# Patient Record
Sex: Female | Born: 1940 | Race: White | Hispanic: No | Marital: Married | Smoking: Never smoker
Health system: Southern US, Community
[De-identification: ages and names within clinical notes are randomized; demographics above are authoritative.]

## PROBLEM LIST (undated history)

## (undated) DIAGNOSIS — M419 Scoliosis, unspecified: Secondary | ICD-10-CM

## (undated) DIAGNOSIS — E039 Hypothyroidism, unspecified: Secondary | ICD-10-CM

## (undated) DIAGNOSIS — E539 Vitamin B deficiency, unspecified: Secondary | ICD-10-CM

## (undated) DIAGNOSIS — D126 Benign neoplasm of colon, unspecified: Secondary | ICD-10-CM

## (undated) DIAGNOSIS — I2510 Atherosclerotic heart disease of native coronary artery without angina pectoris: Secondary | ICD-10-CM

## (undated) DIAGNOSIS — K219 Gastro-esophageal reflux disease without esophagitis: Secondary | ICD-10-CM

## (undated) DIAGNOSIS — H524 Presbyopia: Secondary | ICD-10-CM

## (undated) DIAGNOSIS — I10 Essential (primary) hypertension: Secondary | ICD-10-CM

## (undated) DIAGNOSIS — M5136 Other intervertebral disc degeneration, lumbar region: Secondary | ICD-10-CM

## (undated) DIAGNOSIS — J189 Pneumonia, unspecified organism: Secondary | ICD-10-CM

## (undated) DIAGNOSIS — H5203 Hypermetropia, bilateral: Secondary | ICD-10-CM

## (undated) DIAGNOSIS — L719 Rosacea, unspecified: Secondary | ICD-10-CM

## (undated) DIAGNOSIS — Z973 Presence of spectacles and contact lenses: Secondary | ICD-10-CM

## (undated) DIAGNOSIS — R0600 Dyspnea, unspecified: Secondary | ICD-10-CM

## (undated) DIAGNOSIS — M8580 Other specified disorders of bone density and structure, unspecified site: Secondary | ICD-10-CM

## (undated) DIAGNOSIS — H52203 Unspecified astigmatism, bilateral: Secondary | ICD-10-CM

## (undated) DIAGNOSIS — N200 Calculus of kidney: Secondary | ICD-10-CM

## (undated) DIAGNOSIS — E785 Hyperlipidemia, unspecified: Secondary | ICD-10-CM

## (undated) DIAGNOSIS — E559 Vitamin D deficiency, unspecified: Secondary | ICD-10-CM

## (undated) DIAGNOSIS — M51369 Other intervertebral disc degeneration, lumbar region without mention of lumbar back pain or lower extremity pain: Secondary | ICD-10-CM

## (undated) DIAGNOSIS — E041 Nontoxic single thyroid nodule: Secondary | ICD-10-CM

## (undated) DIAGNOSIS — M1990 Unspecified osteoarthritis, unspecified site: Secondary | ICD-10-CM

## (undated) HISTORY — PX: FRACTURE SURGERY: SHX138

## (undated) HISTORY — PX: COLONOSCOPY: SHX174

---

## 2016-08-14 ENCOUNTER — Other Ambulatory Visit: Payer: Self-pay | Admitting: Certified Registered Nurse Anesthetist

## 2016-08-14 DIAGNOSIS — R5381 Other malaise: Secondary | ICD-10-CM

## 2016-08-15 ENCOUNTER — Other Ambulatory Visit: Payer: Self-pay | Admitting: Certified Registered Nurse Anesthetist

## 2016-08-15 DIAGNOSIS — E2839 Other primary ovarian failure: Secondary | ICD-10-CM

## 2016-09-01 ENCOUNTER — Ambulatory Visit: Payer: Self-pay | Admitting: Physician Assistant

## 2016-09-17 ENCOUNTER — Other Ambulatory Visit: Payer: Self-pay

## 2016-09-17 ENCOUNTER — Encounter (HOSPITAL_COMMUNITY): Payer: Self-pay | Admitting: *Deleted

## 2016-09-17 ENCOUNTER — Encounter (HOSPITAL_COMMUNITY)
Admission: RE | Admit: 2016-09-17 | Discharge: 2016-09-17 | Disposition: A | Discharge status: Home / Self Care | Payer: Medicare Other | Source: Ambulatory Visit | Attending: Orthopedic Surgery | Admitting: Orthopedic Surgery

## 2016-09-17 DIAGNOSIS — I471 Supraventricular tachycardia: Secondary | ICD-10-CM | POA: Diagnosis not present

## 2016-09-17 DIAGNOSIS — M5136 Other intervertebral disc degeneration, lumbar region: Secondary | ICD-10-CM | POA: Insufficient documentation

## 2016-09-17 DIAGNOSIS — I2510 Atherosclerotic heart disease of native coronary artery without angina pectoris: Secondary | ICD-10-CM | POA: Insufficient documentation

## 2016-09-17 DIAGNOSIS — Z01810 Encounter for preprocedural cardiovascular examination: Secondary | ICD-10-CM | POA: Insufficient documentation

## 2016-09-17 DIAGNOSIS — E785 Hyperlipidemia, unspecified: Secondary | ICD-10-CM | POA: Insufficient documentation

## 2016-09-17 DIAGNOSIS — Z01812 Encounter for preprocedural laboratory examination: Secondary | ICD-10-CM | POA: Diagnosis present

## 2016-09-17 DIAGNOSIS — K219 Gastro-esophageal reflux disease without esophagitis: Secondary | ICD-10-CM | POA: Diagnosis not present

## 2016-09-17 DIAGNOSIS — M48061 Spinal stenosis, lumbar region without neurogenic claudication: Secondary | ICD-10-CM | POA: Insufficient documentation

## 2016-09-17 HISTORY — DX: Vitamin D deficiency, unspecified: E55.9

## 2016-09-17 HISTORY — DX: Hypothyroidism, unspecified: E03.9

## 2016-09-17 HISTORY — DX: Hyperlipidemia, unspecified: E78.5

## 2016-09-17 HISTORY — DX: Atherosclerotic heart disease of native coronary artery without angina pectoris: I25.10

## 2016-09-17 HISTORY — DX: Presbyopia: H52.4

## 2016-09-17 HISTORY — DX: Presbyopia: H52.03

## 2016-09-17 HISTORY — DX: Rosacea, unspecified: L71.9

## 2016-09-17 HISTORY — DX: Calculus of kidney: N20.0

## 2016-09-17 HISTORY — DX: Gastro-esophageal reflux disease without esophagitis: K21.9

## 2016-09-17 HISTORY — DX: Nontoxic single thyroid nodule: E04.1

## 2016-09-17 HISTORY — DX: Pneumonia, unspecified organism: J18.9

## 2016-09-17 HISTORY — DX: Other specified disorders of bone density and structure, unspecified site: M85.80

## 2016-09-17 HISTORY — DX: Unspecified osteoarthritis, unspecified site: M19.90

## 2016-09-17 HISTORY — DX: Presence of spectacles and contact lenses: Z97.3

## 2016-09-17 HISTORY — DX: Scoliosis, unspecified: M41.9

## 2016-09-17 HISTORY — DX: Other intervertebral disc degeneration, lumbar region without mention of lumbar back pain or lower extremity pain: M51.369

## 2016-09-17 HISTORY — DX: Unspecified astigmatism, bilateral: H52.203

## 2016-09-17 HISTORY — DX: Vitamin B deficiency, unspecified: E53.9

## 2016-09-17 HISTORY — DX: Other intervertebral disc degeneration, lumbar region: M51.36

## 2016-09-17 HISTORY — DX: Benign neoplasm of colon, unspecified: D12.6

## 2016-09-17 LAB — TYPE AND SCREEN
ABO/RH(D): O POS
Antibody Screen: NEGATIVE

## 2016-09-17 LAB — CBC
HCT: 38.8 % (ref 36.0–46.0)
Hemoglobin: 13.1 g/dL (ref 12.0–15.0)
MCH: 30.6 pg (ref 26.0–34.0)
MCHC: 33.8 g/dL (ref 30.0–36.0)
MCV: 90.7 fL (ref 78.0–100.0)
PLATELETS: 196 10*3/uL (ref 150–400)
RBC: 4.28 MIL/uL (ref 3.87–5.11)
RDW: 13 % (ref 11.5–15.5)
WBC: 6.3 10*3/uL (ref 4.0–10.5)

## 2016-09-17 LAB — BASIC METABOLIC PANEL
Anion gap: 7 (ref 5–15)
BUN: 12 mg/dL (ref 6–20)
CHLORIDE: 108 mmol/L (ref 101–111)
CO2: 24 mmol/L (ref 22–32)
CREATININE: 0.76 mg/dL (ref 0.44–1.00)
Calcium: 10.1 mg/dL (ref 8.9–10.3)
GFR calc Af Amer: >60 mL/min (ref >60)
GFR calc non Af Amer: >60 mL/min (ref >60)
Glucose, Bld: 99 mg/dL (ref 65–99)
Potassium: 3.9 mmol/L (ref 3.5–5.1)
Sodium: 139 mmol/L (ref 135–145)

## 2016-09-17 LAB — SURGICAL PCR SCREEN
MRSA, PCR: NEGATIVE
Staphylococcus aureus: NEGATIVE

## 2016-09-17 LAB — ABO/RH: ABO/RH(D): O POS

## 2016-09-17 NOTE — Progress Notes (Signed)
Pt denies SOB, chest pain, and being under the care of a cardiologist. Pt denies having a cardiac cath. Pt stated that a stress test was performed at [facility] but is not sure if an echo was done; records requested. Pt chart forwarded to anesthesia for review (order for consult).

## 2016-09-17 NOTE — Pre-Procedure Instructions (Signed)
    Clarissa Laird  09/17/2016      [facility] [address] [place] [address] Phone: [phone] Fax: [phone]    Your procedure is scheduled on Wednesday, September 24, 2016  Report to [facility] Admitting at 6:30 A.M.  Call this number if you have problems the morning of surgery:  [phone]   Remember:  Do not eat food or drink liquids after midnight: Tuesday, September 23, 2016  Take these medicines the morning of surgery with A SIP OF WATER : levothyroxine (SYNTHROID),  omeprazole (PRILOSEC), if needed Tylenol for pain. Stop taking Aspirin, vitamins, fish oil and herbal medications. Do not take any NSAIDs ie: Ibuprofen, Advil, Naproxen (Aleve), Motrin, Advil, BC and Goody Powder or any medication containing Aspirin; stop now.  Do not wear jewelry, make-up or nail polish.  Do not wear lotions, powders, or perfumes, or deoderant.  Do not shave 48 hours prior to surgery.    Do not bring valuables to the hospital.  [facility] is not responsible for any belongings or valuables.  Contacts, dentures or bridgework may not be worn into surgery.  Leave your suitcase in the car.  After surgery it may be brought to your room. For patients admitted to the hospital, discharge time will be determined by your treatment team. Special instructions:Shower the night before surgery and the morning of surgery with CHG. Please read over the following fact sheets that you were given. Pain Booklet, Coughing and Deep Breathing, Blood Transfusion Information, MRSA Information and Surgical Site Infection Prevention

## 2016-09-18 ENCOUNTER — Encounter (HOSPITAL_COMMUNITY): Payer: Self-pay

## 2016-09-18 NOTE — Progress Notes (Addendum)
Anesthesia Chart Review: Patient is a 76 year old female scheduled for XLIF L3-4 on 09/24/16 and posterior decompression and in situ fusion L2-5 on 09/25/16 by Dr. Rolena Infante.   History includes never smoker, GERD, scoliosis, HLD, hypothyroidism, rosacea, nephrolithiasis, non-toxic thyroid nodule. Mild coronary calcification on 03/29/13 chest CT [facility], Care Everywhere)--no evidence of interstitial fibrosis noted.  - PCP is Dr. Gilford Rile with [facility], last visit 08/28/16 with Caryl Ada, [place] for preoperative evaluation. She wrote:  "Surgical clearance: The patients overall risk for this surgery is considered to be moderate (6-10%) due to her age, and her complicated health problems. Having said that she is remarkably functional for these problems. I have ordered a series of blood work today and if this blood work shows stability, she may proceed with surgery with no further testing and with known risks. I have discussed these risks with the patient today and they include bleeding complications, infection complications, blood clot complications, respiratory difficulty/complications, and cardiovascular difficulty/complications. The patient understands the risks and would like to proceed. I recommend that this patient have Dr. Rolena Infante' usual DVT prophylaxis for the back surgery, early ambulation as possible, pulmonary toilet, and if complications or problems consider an inpatient medical consult."  - She is not routinely followed by cardiology but according to note from Dr. Gilford Rile and Caryl Ada, [place] (in [place]), patient was referred to cardiology approximately three years ago after finding of coronary calcifications on chest CT. She had a stress test and reportedly was told only PRN follow-up needed. I am attempting to find out where testing was done so I can get records to review.   Meds include ASA 81 mg, vitamin D, B-12, levothyroxine, Prilosec, Align, Crestor.   BP (!)  155/73   Pulse 68   Temp 36.8 C (Oral)   Resp 18   Ht 5\' 4"  (1.626 m)   Wt 133 lb 1 oz (60.4 kg)   SpO2 100%   BMI 22.84 kg/m   EKG 09/17/16: SR with premature supraventricular complexes, moderate voltage criteria for LVH, may be normal variant.  Chest CT 03/29/13 [facility]; Care Everywhere): IMPRESSION: 1. No evidence of interstitial fibrosis. 2. No lung nodules.No consolidation or edema. 3. Mild apical parenchymal scarring.  Preoperative labs noted. CBC and BMET WNL.A1c 5.4 on 08/28/16 at PCP office.   Attempting to get cardiology records from ~ three years ago. Will plan to update note once received.  George Hugh [facility] Short Stay Center/Anesthesiology Phone [phone] 09/18/2016 3:36 PM  Addendum: I spoke with patient. She reports she was referred to Dr. Geraldo Pitter (who was with [facility] at the time) about 3 years ago for finding of coronary calcifications on chest CT and family history of CHF. (Her father died in the 17's at age 51 from angina and CHF.) She reports a normal stress test (+/- echo) with PRN follow-up recommended. She denied chest pain, SOB, edema, syncope. She walks every day for 45 minutes at a slow, steady pace. She reports she is able to "walk and talk." Her last ASA dose was on 09/16/16. Will see if I can get cardiology records from [facility], but based on available information and medical clearance I anticipate that she can proceed as planned if no acute changes. (Update 09/19/16 2:44 PM: So far, only a bone density report received. No cardiac studies. I also called and spoke with staff at Dr. Willette Pa office and they do not have copies of cardiac records--or at least not readily  available. I reviewed above with anesthesiologist Dr. Suzette Battiest who agrees that based on records available, EKG, activity tolerance, and medical clearance it is anticipated that she can proceed as planned. If any additional records received prior to surgery then  I will update my note.)  George Hugh [facility] Short Stay Center/Anesthesiology Phone [phone] 09/18/2016 5:44 PM  Addendum: Records received from [place]. Last visit with Dr. Jyl Heinz was on 01/25/14. Carotid U/S ordered (see below) for findings of carotid bruits.    - Echo 04/09/10: Summary: Normal left ventricular systolic function with mild concentric LVH. LVEF 70%. E/A flow reversal was suggestive of diastolic dysfunction. Mitral annular calcification with mild mitral insufficiency. Aortic sclerosis with mild aortic insufficiency. Mild tricuspid regurgitation with normal pulmonary artery pressures.  - Exercise myocardial perfusion study 03/10/13: Post stress gated SPECT was performed and showed normal wall motion and thickening with ejection fraction calculated 68%. Tomographic images at rest and stress showed no evidence of ischemia. Soft tissue attenuation noted in the inferior wall of the LV on the scan. Patient achieved 88% of maximal predicted heart rate. Good exercise capacity. Resting EKG showed normal sinus rhythm and was within normal limits. There were no ECG changes with exercise.  Conclusion: Negative exercise perfusion stress test.   - Carotid U/S 02/01/14:  No evidence of hemodynamically significant stenosis in bilateral carotid bifurcation. Abnormal thyroid appearance, consider further evaluation. (Per Care Everywhere, she did have a thyroid ultrasound on 08/27/15 although I do not have the report.)   George Hugh [facility] Short Stay Center/Anesthesiology Phone [phone] 09/23/2016 4:05 PM

## 2016-09-23 NOTE — Anesthesia Preprocedure Evaluation (Addendum)
Anesthesia Evaluation  Patient identified by MRN, date of birth, ID band Patient awake    Reviewed: Allergy & Precautions, H&P , NPO status , Patient's Chart, lab work & pertinent test results  Airway Mallampati: II  TM Distance: >3 FB Neck ROM: Full    Dental no notable dental hx. (+) Teeth Intact, Dental Advisory Given   Pulmonary neg pulmonary ROS,    Pulmonary exam normal breath sounds clear to auscultation       Cardiovascular Exercise Tolerance: Good negative cardio ROS   Rhythm:Regular Rate:Normal     Neuro/Psych negative neurological ROS  negative psych ROS   GI/Hepatic Neg liver ROS, GERD  Medicated and Controlled,  Endo/Other  Hypothyroidism   Renal/GU negative Renal ROS  negative genitourinary   Musculoskeletal  (+) Arthritis , Osteoarthritis,    Abdominal   Peds  Hematology negative hematology ROS (+)   Anesthesia Other Findings   Reproductive/Obstetrics negative OB ROS                           Anesthesia Physical Anesthesia Plan  ASA: II  Anesthesia Plan: General   Post-op Pain Management:    Induction: Intravenous  PONV Risk Score and Plan: 4 or greater and Ondansetron, Dexamethasone and Midazolam  Airway Management Planned: Oral ETT  Additional Equipment:   Intra-op Plan:   Post-operative Plan: Extubation in OR  Informed Consent: I have reviewed the patients History and Physical, chart, labs and discussed the procedure including the risks, benefits and alternatives for the proposed anesthesia with the patient or authorized representative who has indicated his/her understanding and acceptance.   Dental advisory given  Plan Discussed with: CRNA  Anesthesia Plan Comments:         Anesthesia Quick Evaluation

## 2016-09-24 ENCOUNTER — Inpatient Hospital Stay (HOSPITAL_COMMUNITY): Payer: Medicare Other | Admitting: Vascular Surgery

## 2016-09-24 ENCOUNTER — Inpatient Hospital Stay (HOSPITAL_COMMUNITY): Payer: Medicare Other | Admitting: Anesthesiology

## 2016-09-24 ENCOUNTER — Inpatient Hospital Stay (HOSPITAL_COMMUNITY): Payer: Medicare Other | Admitting: Certified Registered"

## 2016-09-24 ENCOUNTER — Inpatient Hospital Stay (HOSPITAL_COMMUNITY): Payer: Medicare Other

## 2016-09-24 ENCOUNTER — Inpatient Hospital Stay (HOSPITAL_COMMUNITY)
Admission: RE | Admit: 2016-09-24 | Discharge: 2016-09-25 | DRG: 460 | Disposition: A | Discharge status: Home / Self Care | Payer: Medicare Other | Source: Ambulatory Visit | Attending: Orthopedic Surgery | Admitting: Orthopedic Surgery

## 2016-09-24 ENCOUNTER — Encounter (HOSPITAL_COMMUNITY)
Admission: RE | Disposition: A | Discharge status: Home / Self Care | Payer: Self-pay | Source: Ambulatory Visit | Attending: Orthopedic Surgery

## 2016-09-24 DIAGNOSIS — Z809 Family history of malignant neoplasm, unspecified: Secondary | ICD-10-CM

## 2016-09-24 DIAGNOSIS — M48062 Spinal stenosis, lumbar region with neurogenic claudication: Principal | ICD-10-CM | POA: Diagnosis present

## 2016-09-24 DIAGNOSIS — M8580 Other specified disorders of bone density and structure, unspecified site: Secondary | ICD-10-CM | POA: Diagnosis present

## 2016-09-24 DIAGNOSIS — M5136 Other intervertebral disc degeneration, lumbar region: Secondary | ICD-10-CM | POA: Diagnosis not present

## 2016-09-24 DIAGNOSIS — Z9889 Other specified postprocedural states: Secondary | ICD-10-CM

## 2016-09-24 DIAGNOSIS — E039 Hypothyroidism, unspecified: Secondary | ICD-10-CM | POA: Diagnosis present

## 2016-09-24 DIAGNOSIS — K219 Gastro-esophageal reflux disease without esophagitis: Secondary | ICD-10-CM | POA: Diagnosis present

## 2016-09-24 DIAGNOSIS — E539 Vitamin B deficiency, unspecified: Secondary | ICD-10-CM | POA: Diagnosis present

## 2016-09-24 DIAGNOSIS — I2510 Atherosclerotic heart disease of native coronary artery without angina pectoris: Secondary | ICD-10-CM | POA: Diagnosis not present

## 2016-09-24 DIAGNOSIS — Z7982 Long term (current) use of aspirin: Secondary | ICD-10-CM

## 2016-09-24 DIAGNOSIS — Z981 Arthrodesis status: Secondary | ICD-10-CM

## 2016-09-24 DIAGNOSIS — E559 Vitamin D deficiency, unspecified: Secondary | ICD-10-CM | POA: Diagnosis present

## 2016-09-24 DIAGNOSIS — Z833 Family history of diabetes mellitus: Secondary | ICD-10-CM

## 2016-09-24 DIAGNOSIS — M4320 Fusion of spine, site unspecified: Secondary | ICD-10-CM

## 2016-09-24 DIAGNOSIS — Z8249 Family history of ischemic heart disease and other diseases of the circulatory system: Secondary | ICD-10-CM

## 2016-09-24 DIAGNOSIS — E785 Hyperlipidemia, unspecified: Secondary | ICD-10-CM | POA: Diagnosis not present

## 2016-09-24 DIAGNOSIS — Z87442 Personal history of urinary calculi: Secondary | ICD-10-CM | POA: Diagnosis not present

## 2016-09-24 DIAGNOSIS — Z780 Asymptomatic menopausal state: Secondary | ICD-10-CM

## 2016-09-24 DIAGNOSIS — Z419 Encounter for procedure for purposes other than remedying health state, unspecified: Secondary | ICD-10-CM

## 2016-09-24 DIAGNOSIS — M4186 Other forms of scoliosis, lumbar region: Secondary | ICD-10-CM | POA: Diagnosis present

## 2016-09-24 DIAGNOSIS — M549 Dorsalgia, unspecified: Secondary | ICD-10-CM | POA: Diagnosis present

## 2016-09-24 HISTORY — PX: ANTERIOR LAT LUMBAR FUSION: SHX1168

## 2016-09-24 SURGERY — ANTERIOR LATERAL LUMBAR FUSION 1 LEVEL
Anesthesia: General

## 2016-09-24 MED ORDER — FENTANYL CITRATE (PF) 100 MCG/2ML IJ SOLN
INTRAMUSCULAR | Status: DC | PRN
Start: 2016-09-24 — End: 2016-09-24
  Administered 2016-09-24: 100 ug via INTRAVENOUS
  Administered 2016-09-24 (×3): 50 ug via INTRAVENOUS

## 2016-09-24 MED ORDER — ACETAMINOPHEN 10 MG/ML IV SOLN
INTRAVENOUS | Status: DC | PRN
  Administered 2016-09-24: 1000 mg via INTRAVENOUS

## 2016-09-24 MED ORDER — FENTANYL CITRATE (PF) 250 MCG/5ML IJ SOLN
INTRAMUSCULAR | Status: AC
  Filled 2016-09-24: qty 5

## 2016-09-24 MED ORDER — ONDANSETRON HCL 4 MG/2ML IJ SOLN
4.0000 mg | Freq: Four times a day (QID) | INTRAMUSCULAR | Status: DC | PRN
  Administered 2016-09-24 – 2016-09-25 (×2): 4 mg via INTRAVENOUS
  Filled 2016-09-24 (×2): qty 2

## 2016-09-24 MED ORDER — ACETAMINOPHEN 10 MG/ML IV SOLN
INTRAVENOUS | Status: AC
  Filled 2016-09-24: qty 100

## 2016-09-24 MED ORDER — MAGNESIUM CITRATE PO SOLN: 1.0000 | Freq: Once | ORAL | Status: DC | PRN

## 2016-09-24 MED ORDER — LACTATED RINGERS IV SOLN
INTRAVENOUS | Status: DC | PRN
  Administered 2016-09-24 (×2): via INTRAVENOUS

## 2016-09-24 MED ORDER — BUPIVACAINE-EPINEPHRINE 0.25% -1:200000 IJ SOLN
INTRAMUSCULAR | Status: DC | PRN
  Administered 2016-09-24: 15 mL

## 2016-09-24 MED ORDER — PROPOFOL 10 MG/ML IV BOLUS
INTRAVENOUS | Status: DC | PRN
  Administered 2016-09-24: 120 mg via INTRAVENOUS

## 2016-09-24 MED ORDER — PHENYLEPHRINE 40 MCG/ML (10ML) SYRINGE FOR IV PUSH (FOR BLOOD PRESSURE SUPPORT)
PREFILLED_SYRINGE | INTRAVENOUS | Status: AC
  Filled 2016-09-24: qty 10

## 2016-09-24 MED ORDER — LEVOTHYROXINE SODIUM 100 MCG PO TABS
50.0000 ug | ORAL_TABLET | Freq: Every day | ORAL | Status: DC
  Administered 2016-09-25: 50 ug via ORAL
  Filled 2016-09-24: qty 1

## 2016-09-24 MED ORDER — ONDANSETRON HCL 4 MG/2ML IJ SOLN
INTRAMUSCULAR | Status: DC | PRN
  Administered 2016-09-24: 4 mg via INTRAVENOUS

## 2016-09-24 MED ORDER — MENTHOL 3 MG MT LOZG: 1.0000 | LOZENGE | OROMUCOSAL | Status: DC | PRN

## 2016-09-24 MED ORDER — DEXAMETHASONE SODIUM PHOSPHATE 10 MG/ML IJ SOLN
INTRAMUSCULAR | Status: AC
  Filled 2016-09-24: qty 1

## 2016-09-24 MED ORDER — THROMBIN 20000 UNITS EX SOLR
CUTANEOUS | Status: AC
Start: 2016-09-24 — End: 2016-09-24
  Filled 2016-09-24: qty 20000

## 2016-09-24 MED ORDER — LIDOCAINE HCL (CARDIAC) 20 MG/ML IV SOLN
INTRAVENOUS | Status: DC | PRN
  Administered 2016-09-24: 60 mg via INTRAVENOUS

## 2016-09-24 MED ORDER — PROPOFOL 500 MG/50ML IV EMUL
INTRAVENOUS | Status: DC | PRN
  Administered 2016-09-24: 50 ug/kg/min via INTRAVENOUS

## 2016-09-24 MED ORDER — CEFAZOLIN SODIUM-DEXTROSE 2-4 GM/100ML-% IV SOLN
2.0000 g | Freq: Three times a day (TID) | INTRAVENOUS | Status: AC
  Administered 2016-09-24 (×2): 2 g via INTRAVENOUS
  Filled 2016-09-24 (×2): qty 100

## 2016-09-24 MED ORDER — ACETAMINOPHEN 650 MG RE SUPP: 650.0000 mg | RECTAL | Status: DC | PRN

## 2016-09-24 MED ORDER — DOCUSATE SODIUM 100 MG PO CAPS
100.0000 mg | ORAL_CAPSULE | Freq: Two times a day (BID) | ORAL | Status: DC
  Administered 2016-09-24 – 2016-09-25 (×2): 100 mg via ORAL
  Filled 2016-09-24 (×2): qty 1

## 2016-09-24 MED ORDER — OXYCODONE HCL 5 MG PO TABS
5.0000 mg | ORAL_TABLET | ORAL | Status: DC | PRN
  Administered 2016-09-24: 10 mg via ORAL
  Administered 2016-09-25: 5 mg via ORAL
  Filled 2016-09-24 (×2): qty 2

## 2016-09-24 MED ORDER — DEXAMETHASONE SODIUM PHOSPHATE 10 MG/ML IJ SOLN
INTRAMUSCULAR | Status: DC | PRN
  Administered 2016-09-24: 10 mg via INTRAVENOUS

## 2016-09-24 MED ORDER — PHENYLEPHRINE HCL 10 MG/ML IJ SOLN
INTRAVENOUS | Status: DC | PRN
  Administered 2016-09-24: 50 ug/min via INTRAVENOUS

## 2016-09-24 MED ORDER — MORPHINE SULFATE (PF) 4 MG/ML IV SOLN
INTRAVENOUS | Status: AC
  Filled 2016-09-24: qty 1

## 2016-09-24 MED ORDER — MORPHINE SULFATE (PF) 4 MG/ML IV SOLN
1.0000 mg | INTRAVENOUS | Status: DC | PRN
  Administered 2016-09-24 – 2016-09-25 (×2): 2 mg via INTRAVENOUS
  Filled 2016-09-24: qty 1

## 2016-09-24 MED ORDER — HYDROMORPHONE HCL 1 MG/ML IJ SOLN
0.2500 mg | INTRAMUSCULAR | Status: DC | PRN
  Administered 2016-09-24 (×4): 0.5 mg via INTRAVENOUS

## 2016-09-24 MED ORDER — CEFAZOLIN SODIUM-DEXTROSE 2-4 GM/100ML-% IV SOLN: 2.0000 g | INTRAVENOUS | Status: DC

## 2016-09-24 MED ORDER — SODIUM CHLORIDE 0.9 % IV SOLN: 250.0000 mL | INTRAVENOUS | Status: DC

## 2016-09-24 MED ORDER — HYDROMORPHONE HCL 1 MG/ML IJ SOLN
INTRAMUSCULAR | Status: AC
  Administered 2016-09-24: 0.5 mg via INTRAVENOUS
  Filled 2016-09-24: qty 1

## 2016-09-24 MED ORDER — ACETAMINOPHEN 500 MG PO TABS: 1000.0000 mg | ORAL_TABLET | Freq: Three times a day (TID) | ORAL | Status: DC | PRN

## 2016-09-24 MED ORDER — HYDROXYZINE HCL 50 MG/ML IM SOLN
50.0000 mg | Freq: Four times a day (QID) | INTRAMUSCULAR | Status: DC | PRN
  Administered 2016-09-24: 50 mg via INTRAMUSCULAR
  Filled 2016-09-24: qty 1

## 2016-09-24 MED ORDER — ROSUVASTATIN CALCIUM 5 MG PO TABS
5.0000 mg | ORAL_TABLET | Freq: Every day | ORAL | Status: DC
  Administered 2016-09-25: 5 mg via ORAL
  Filled 2016-09-24: qty 1

## 2016-09-24 MED ORDER — POLYETHYLENE GLYCOL 3350 17 G PO PACK: 17.0000 g | PACK | Freq: Every day | ORAL | Status: DC | PRN

## 2016-09-24 MED ORDER — LIDOCAINE HCL 4 % EX SOLN
CUTANEOUS | Status: DC | PRN
  Administered 2016-09-24: 4 mL via TOPICAL

## 2016-09-24 MED ORDER — 0.9 % SODIUM CHLORIDE (POUR BTL) OPTIME
TOPICAL | Status: DC | PRN
  Administered 2016-09-24: 1000 mL

## 2016-09-24 MED ORDER — ONDANSETRON HCL 4 MG PO TABS: 4.0000 mg | ORAL_TABLET | Freq: Four times a day (QID) | ORAL | Status: DC | PRN

## 2016-09-24 MED ORDER — ACETAMINOPHEN 325 MG PO TABS: 650.0000 mg | ORAL_TABLET | ORAL | Status: DC | PRN

## 2016-09-24 MED ORDER — CEFAZOLIN SODIUM-DEXTROSE 2-4 GM/100ML-% IV SOLN
2.0000 g | INTRAVENOUS | Status: AC
  Administered 2016-09-24: 2 g via INTRAVENOUS
  Filled 2016-09-24: qty 100

## 2016-09-24 MED ORDER — HYDROMORPHONE HCL 1 MG/ML IJ SOLN
INTRAMUSCULAR | Status: AC
Start: 2016-09-24 — End: 2016-09-24
  Administered 2016-09-24: 0.5 mg via INTRAVENOUS
  Filled 2016-09-24: qty 1

## 2016-09-24 MED ORDER — PHENOL 1.4 % MT LIQD: 1.0000 | OROMUCOSAL | Status: DC | PRN

## 2016-09-24 MED ORDER — LACTATED RINGERS IV SOLN
INTRAVENOUS | Status: DC
  Administered 2016-09-24: 23:00:00 via INTRAVENOUS

## 2016-09-24 MED ORDER — SODIUM CHLORIDE 0.9% FLUSH: 3.0000 mL | INTRAVENOUS | Status: DC | PRN

## 2016-09-24 MED ORDER — SUCCINYLCHOLINE CHLORIDE 20 MG/ML IJ SOLN
INTRAMUSCULAR | Status: DC | PRN
  Administered 2016-09-24: 100 mg via INTRAVENOUS

## 2016-09-24 MED ORDER — BUPIVACAINE-EPINEPHRINE 0.25% -1:200000 IJ SOLN
INTRAMUSCULAR | Status: AC
  Filled 2016-09-24: qty 1

## 2016-09-24 MED ORDER — PANTOPRAZOLE SODIUM 40 MG PO TBEC
40.0000 mg | DELAYED_RELEASE_TABLET | Freq: Every day | ORAL | Status: DC
  Administered 2016-09-25: 40 mg via ORAL
  Filled 2016-09-24: qty 1

## 2016-09-24 MED ORDER — SODIUM CHLORIDE 0.9% FLUSH
3.0000 mL | Freq: Two times a day (BID) | INTRAVENOUS | Status: DC
  Administered 2016-09-24: 3 mL via INTRAVENOUS

## 2016-09-24 MED ORDER — THROMBIN 20000 UNITS EX KIT
PACK | CUTANEOUS | Status: DC | PRN
  Administered 2016-09-24: 20000 [IU] via TOPICAL

## 2016-09-24 MED ORDER — PROPOFOL 10 MG/ML IV BOLUS
INTRAVENOUS | Status: AC
  Filled 2016-09-24: qty 20

## 2016-09-24 SURGICAL SUPPLY — 73 items
BENZOIN TINCTURE PRP APPL 2/3 (GAUZE/BANDAGES/DRESSINGS) ×3 IMPLANT
BLADE CLIPPER SURG (BLADE) IMPLANT
BLADE SURG 10 STRL SS (BLADE) ×3 IMPLANT
BONE MATRIX OSTEOCEL PRO MED (Bone Implant) ×3 IMPLANT
CAGE MODULUS XL 12X18X55 - 10 (Cage) ×3 IMPLANT
CLOSURE STERI-STRIP 1/2X4 (GAUZE/BANDAGES/DRESSINGS) ×1
CLOSURE WOUND 1/2 X4 (GAUZE/BANDAGES/DRESSINGS)
CLSR STERI-STRIP ANTIMIC 1/2X4 (GAUZE/BANDAGES/DRESSINGS) ×2 IMPLANT
CORDS BIPOLAR (ELECTRODE) ×3 IMPLANT
COVER SURGICAL LIGHT HANDLE (MISCELLANEOUS) ×3 IMPLANT
DRAPE C-ARM 42X72 X-RAY (DRAPES) ×3 IMPLANT
DRAPE C-ARMOR (DRAPES) ×3 IMPLANT
DRAPE INCISE IOBAN 66X45 STRL (DRAPES) ×3 IMPLANT
DRAPE ORTHO SPLIT 77X108 STRL (DRAPES) ×2
DRAPE POUCH INSTRU U-SHP 10X18 (DRAPES) ×3 IMPLANT
DRAPE SURG ORHT 6 SPLT 77X108 (DRAPES) ×1 IMPLANT
DRAPE U-SHAPE 47X51 STRL (DRAPES) ×6 IMPLANT
DRSG AQUACEL AG ADV 3.5X10 (GAUZE/BANDAGES/DRESSINGS) IMPLANT
DRSG OPSITE POSTOP 3X4 (GAUZE/BANDAGES/DRESSINGS) ×3 IMPLANT
DRSG OPSITE POSTOP 4X6 (GAUZE/BANDAGES/DRESSINGS) ×3 IMPLANT
DURAPREP 26ML APPLICATOR (WOUND CARE) ×3 IMPLANT
ELECT BLADE 4.0 EZ CLEAN MEGAD (MISCELLANEOUS) ×3
ELECT PENCIL ROCKER SW 15FT (MISCELLANEOUS) ×3 IMPLANT
ELECT REM PT RETURN 9FT ADLT (ELECTROSURGICAL) ×3
ELECTRODE BLDE 4.0 EZ CLN MEGD (MISCELLANEOUS) ×1 IMPLANT
ELECTRODE REM PT RTRN 9FT ADLT (ELECTROSURGICAL) ×1 IMPLANT
GAUZE SPONGE 4X4 16PLY XRAY LF (GAUZE/BANDAGES/DRESSINGS) ×3 IMPLANT
GLOVE BIO SURGEON STRL SZ 6.5 (GLOVE) ×2 IMPLANT
GLOVE BIO SURGEONS STRL SZ 6.5 (GLOVE) ×1
GLOVE BIOGEL PI IND STRL 6.5 (GLOVE) ×1 IMPLANT
GLOVE BIOGEL PI IND STRL 8.5 (GLOVE) ×1 IMPLANT
GLOVE BIOGEL PI INDICATOR 6.5 (GLOVE) ×2
GLOVE BIOGEL PI INDICATOR 8.5 (GLOVE) ×2
GLOVE SS BIOGEL STRL SZ 8.5 (GLOVE) ×1 IMPLANT
GLOVE SUPERSENSE BIOGEL SZ 8.5 (GLOVE) ×2
GOWN STRL REUS W/ TWL XL LVL3 (GOWN DISPOSABLE) ×2 IMPLANT
GOWN STRL REUS W/TWL 2XL LVL3 (GOWN DISPOSABLE) ×6 IMPLANT
GOWN STRL REUS W/TWL XL LVL3 (GOWN DISPOSABLE) ×4
KIT BASIN OR (CUSTOM PROCEDURE TRAY) ×3 IMPLANT
KIT DILATOR XLIF 5 (KITS) ×1 IMPLANT
KIT ROOM TURNOVER OR (KITS) ×3 IMPLANT
KIT SURGICAL ACCESS MAXCESS 4 (KITS) ×3 IMPLANT
KIT XLIF (KITS) ×2
MODULE NVM5 NEXT GEN EMG (NEEDLE) ×3 IMPLANT
NEEDLE 22X1 1/2 (OR ONLY) (NEEDLE) ×3 IMPLANT
NEEDLE SPNL 18GX3.5 QUINCKE PK (NEEDLE) ×3 IMPLANT
NS IRRIG 1000ML POUR BTL (IV SOLUTION) ×3 IMPLANT
PACK LAMINECTOMY ORTHO (CUSTOM PROCEDURE TRAY) ×3 IMPLANT
PACK UNIVERSAL I (CUSTOM PROCEDURE TRAY) ×3 IMPLANT
PAD ARMBOARD 7.5X6 YLW CONV (MISCELLANEOUS) ×6 IMPLANT
PLATE DECADE XLIP 2H SZ12 (Plate) ×3 IMPLANT
PUTTY DBX 5CC (Putty) ×3 IMPLANT
SCREW DECADE 5.5X50 (Screw) ×6 IMPLANT
SPONGE LAP 4X18 X RAY DECT (DISPOSABLE) ×3 IMPLANT
SPONGE SURGIFOAM ABS GEL 100 (HEMOSTASIS) ×3 IMPLANT
STAPLER VISISTAT 35W (STAPLE) ×3 IMPLANT
STRIP CLOSURE SKIN 1/2X4 (GAUZE/BANDAGES/DRESSINGS) IMPLANT
SURGIFLO W/THROMBIN 8M KIT (HEMOSTASIS) IMPLANT
SUT BONE WAX W31G (SUTURE) ×3 IMPLANT
SUT MON AB 3-0 SH 27 (SUTURE) ×4
SUT MON AB 3-0 SH27 (SUTURE) ×2 IMPLANT
SUT VIC AB 1 CT1 27 (SUTURE) ×4
SUT VIC AB 1 CT1 27XBRD ANBCTR (SUTURE) ×2 IMPLANT
SUT VIC AB 1 CTX 36 (SUTURE) ×4
SUT VIC AB 1 CTX36XBRD ANBCTR (SUTURE) ×2 IMPLANT
SUT VIC AB 2-0 CT1 18 (SUTURE) ×6 IMPLANT
SYR BULB IRRIGATION 50ML (SYRINGE) ×3 IMPLANT
SYR CONTROL 10ML LL (SYRINGE) ×3 IMPLANT
TAPE CLOTH 4X10 WHT NS (GAUZE/BANDAGES/DRESSINGS) ×6 IMPLANT
TOWEL OR 17X24 6PK STRL BLUE (TOWEL DISPOSABLE) ×3 IMPLANT
TOWEL OR 17X26 10 PK STRL BLUE (TOWEL DISPOSABLE) ×3 IMPLANT
TRAY FOLEY CATH SILVER 16FR (SET/KITS/TRAYS/PACK) ×3 IMPLANT
WATER STERILE IRR 1000ML POUR (IV SOLUTION) ×3 IMPLANT

## 2016-09-24 NOTE — Anesthesia Procedure Notes (Signed)
Procedure Name: Intubation Date/Time: 09/24/2016 8:32 AM Performed by: Lavell Luster Pre-anesthesia Checklist: Patient identified, Emergency Drugs available, Suction available, Patient being monitored and Timeout performed Patient Re-evaluated:Patient Re-evaluated prior to induction Oxygen Delivery Method: Circle system utilized Preoxygenation: Pre-oxygenation with 100% oxygen Induction Type: IV induction Ventilation: Mask ventilation without difficulty Laryngoscope Size: Mac and 3 Grade View: Grade I Tube type: Oral Number of attempts: 1 Airway Equipment and Method: Stylet and LTA kit utilized Placement Confirmation: ETT inserted through vocal cords under direct vision,  positive ETCO2 and breath sounds checked- equal and bilateral Secured at: 22 cm Tube secured with: Tape Dental Injury: Teeth and Oropharynx as per pre-operative assessment

## 2016-09-24 NOTE — H&P (Signed)
History of Present Illness  The patient is a 76 year old female who comes in today for a preoperative History and Physical. The patient is scheduled for a Day 1- XLIF L3-4 Day 2 Posterior Decompression w/Insitu Fusion L2-5 to be performed by Dr. Duane Lope D. Rolena Infante, MD at [facility] on 01/29/2016 and 01/30/2016 . Pt reports hx of good health.  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 Congestive Heart Failure  Father. Diabetes Mellitus  Brother. First Degree Relatives  reported Heart Disease  Father. Heart disease in female family member before age 56   Social History Children  4 Current work status  retired Furniture conservator/restorer daily; does running / walking Living situation  live with spouse Marital status  married Never consumed alcohol  08/08/2016: Never consumed alcohol No history of drug/alcohol rehab  Not under pain contract  Number of flights of stairs before winded  2-3 Tobacco / smoke exposure  08/08/2016: no Tobacco use  Never smoker. 08/08/2016  Medication History  Tylenol (500MG  Capsule, Oral) Active. Levothyroxine Sodium (50MCG Tablet, Oral) Active. Rosuvastatin Calcium (5MG  Tablet, Oral) Active. Omeprazole (20MG  Capsule DR, Oral) Active. Cyanocobalamin (1000MCG/ML Solution, Injection) Active. Aspirin (81MG  Tablet DR, Oral) Active. Vitamin D (Cholecalciferol) (1000UNIT Capsule, Oral) Active. Align (Oral) Active. Doxycycline Hyclate (100MG  Capsule, Oral) Active. (prn) Medications Reconciled  Vitals 09/15/2016 1:23 PM Temp.: 98.29F  Pulse: 75 (Regular)  BP: 178/64 (Sitting, Left Arm, Standard)  General General  Appearance-Not in acute distress. Orientation-Oriented X3. Build & Nutrition-Well nourished and Well developed.  Integumentary General Characteristics Surgical Scars - no surgical scar evidence of previous lumbar surgery. Lumbar Spine-Skin examination of the lumbar spine is without deformity, skin lesions, lacerations or abrasions.  Chest and Lung Exam Auscultation Breath sounds - Normal and Clear.  Cardiovascular Auscultation Rhythm - Regular rate and rhythm.  Abdomen Palpation/Percussion Palpation and Percussion of the abdomen reveal - Soft, Non Tender and No Rebound tenderness.  Peripheral Vascular Lower Extremity Palpation - Posterior tibial pulse - Bilateral - 2+. Dorsalis pedis pulse - Bilateral - 2+.  Neurologic Sensation Lower Extremity - Bilateral - sensation is intact in the lower extremity. Reflexes Patellar Reflex - Bilateral - 2+. Achilles Reflex - Bilateral - 2+. Clonus - Bilateral - clonus not present. Hoffman's Sign - Bilateral - Hoffman's sign not present. Testing Seated Straight Leg Raise - Left - Seated straight leg raise positive. Right - Seated straight leg raise negative.  Musculoskeletal Spine/Ribs/Pelvis  Lumbosacral Spine: Inspection and Palpation - Tenderness - left lumbar paraspinals tender to palpation and right lumbar paraspinals tender to palpation. Strength and Tone: Strength - Hip Flexion - Bilateral - 5/5. Knee Extension - Bilateral - 5/5. Knee Flexion - Bilateral - 5/5. Ankle Dorsiflexion - Bilateral - 5/5. Ankle Plantarflexion - Bilateral - 5/5. Heel walk - Bilateral - able to heel walk with moderate difficulty. Toe Walk - Bilateral - able to walk on toes with moderate difficulty. ROM - Flexion - mildly decreased range of motion. Extension - moderately decreased range of motion and painful. Left Lateral Bending - moderately decreased range of motion and painful. Right Lateral Bending - moderately decreased range of motion and painful.  Right Rotation - moderately decreased range of motion and painful. Left Rotation - moderately  decreased range of motion and painful. Pain - extension is more painful than flexion. Lumbosacral Spine - Waddell's Signs - no Waddell's signs present. Lower Extremity Range of Motion - No true hip, knee or ankle pain with range of motion. Gait and Station - [employer] - no assistive devices.  RADIOGRAPHS At this point her we got plain x-rays of her lumbar spine. She has a degenerative scoliosis apex at L3-4 with a 15 degree local scoliotic curve.   Her MRI from 08/12/2016 shows multilevel spinal stenosis. She has severe central and bilateral lateral recess stenosis at 4-5 and 3-4. She has moderate stenosis 1-2 and 2-3. There is also a multilevel degenerative slip, 3-4 is a grade 1 borderline 2.  Plan Transcription  At this point to address the spinal stenosis would require a multilevel decompression. However, in doing that my concern is that I would intensify the degenerative curve by doing a decompression only and this would worsen her overall symptoms. Given that I think the best option is to do a lateral interbody fusion at L3-4 where she has significant stenosis as well as the apex of her curve and the degenerative slip. Then on the next day do a posterior decompression at 4-5, 3-4 and possibly 2-3 depending upon how severe it was during intraoperative evaluation. I would also do a supplemental posterior lateral arthrodesis.   We reviewed the risks which include infection, bleeding, nerve damage, death, stroke, paralysis, failure to heal, need for further surgery, weakness of the hip flexors due to injury to the lumbar plexus, difficulty walking, leak of spinal fluid, ongoing or worse pain, loss in bowel and bladder control. All of their questions were addressed.   The surgical plan is a lateral interbody fusion with lateral instrumentation at 3-4 on day one. If there is significant improvement in  her neuropathic leg pain and she is ambulating with no significant neuropathic leg pain, then I would just use a brace and monitor her and I would not do the posterior decompression. If she continues to have symptoms of neurogenic claudication then the second day I would move forward with a decompression and fusion.

## 2016-09-24 NOTE — Op Note (Signed)
Operative report.  Preoperative diagnosis. 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 Degenerative lumbar scoliosis. Degenerative lumbar spinal stenosis with neurogenic claudication L3-4, L4-5.  Postoperative diagnosis. Same.  Operative procedure. Lateral interbody fusion L3/4. Lateral instrumented fusion L3-4 using lateral 2-hole side plate.  Utilizing invasive modulus XL intervertebral cage. Size 12 x 18 x 55, 10 lordosis. 2 hole lateral plate secured with 50 mm screws x2  First assistant [facility].  Indications. This is a very active 76 year old woman is been having progressive debilitating back buttock bilateral leg pain. Medical symptoms consistent with lumbar spinal stenosis with neurogenic claudication. Imaging studies revealed degenerative lumbar scoliosis apex L3-4, as well as a grade 1 anterolisthesis of L3-4. Patient had maximum degree of spinal stenosis at L3-4, and to a lesser degree L4-5. After discussing treatment options, and failing all conservative measures, we elected to proceed with surgery. All appropriate risks benefits and alternatives were discussed with the patient. Plan was a staged lateral interbody fusion, followed by posterior decompression and in situ fusion if symptoms warrant.  Operative note. Patient was brought the operating room placed on the operating room table. After successful induction of general anesthesia and endotracheal intubation teds SCDs and a Foley were inserted. The appropriate intraoperative neuro monitoring pads were applied to the patient. She was turned into the right lateral decubitus position (right side up). Axillary roll was placed underneath the left chest all bony prominences were padded. Patient was secured to the bed with tape. X-rays were taken to center the L3-4 vertebral body in both the AP and lateral planes. Once I had adequately position the patient on the table the right lateral flank was prepped and draped in a standard  fashion.  Timeout was taken to confirm patient procedure and all other important data. X-ray was then brought into the field and identified the anterior posterior margins of the L3-4 disc space. This was marked out and infiltrated with quarter percent Marcaine with epinephrine. A second smaller incision was marked with the pen 1 fingerbreadth posteriorly and infiltrated as well. At this point the lateral incision was made and I sharply dissected down to the external oblique fascia. I then made a smaller posterior incision pass my finger down to the posterior aspect of the retroperitoneal fashion. I punched through this fascia and then palpated the psoas and mobilize the retroperitoneal fat. I then was able to palpate the undersurface of the oblique and then bluntly dissect through the external oblique until I could visualize my finger in the initial lateral incision. This allowed me to pass the trochars down to the lateral aspect of the spine. Once I confirmed I was right over the L3-4 disc space I stimulated the psoas to ensure that I was not traumatizing the plexus once I confirm this I advanced my first trocar through the psoas down to the lateral aspect of the disc. I then circumferentially stimulated to ensure was not traumatizing plexus and confirmed in both the AP and lateral planes and I was well-positioned. I then placed my guide pin through this to maintain the position. I then sequentially dilated stimulating circumferentially with each dilator until I placed the final working trocar. With a working portal established I stimulated 10 confirm that satisfactory position and then placed my shim into the disc space to maintain my position in the reduction of the retractor. With this positioned I then was able to rotate mobilize the psoas anteriorly so I could have excellent visualization of the lateral aspect of the disc space. Once  I confirm satisfactory position and exposure of the disc in both planes I  again stimulated to ensure that the plexus was not under compression or being traumatized.  I incised the disc space with a 15 blade scalpel and then using pituitary rongeurs curettes Kerrison rongeurs I removed all the disc material. I then used my osteotome to release the contralateral annulus. At this point I then sequentially trialed intervertebral spacers. The size 12 lordotic spacer provided the best overall fit. It corrected the deformity Rensi scoliosis) and provided excellent indirect decompression of the contralateral foramen.  At this point I then passed the endplates I had bleeding subchondral bone irrigated copiously normal saline. I then obtained the implant which is the modulus XL 12 x 18 x 55 10 lordotic new invasive cage. This was packed with OsteoCell plus DBX. I malleted this to the appropriate depth. It was well seated. There was no radiographic evidence of subsidence or improper malpositioning. With the cage properly positioned I then obtained the 2-hole lateral plate and secured it over the L3 and L4 vertebral bodies. I then placed the 55 mm screws. I got excellent purchase of both screws (bicortical). I then locked the screws with the torque according manufacturer's standards. The plate was also torqued and locked in place.  At this point I copiously irrigated the wound with normal saline and made sure hemostasis using bipolar cautery and FloSeal. The shim was removed as was a self-retaining retractors. Final x-rays were taken which were satisfactory. Hardware and graft were in good position. The coronal and sagittal alignment was improved compared to preop.  The fascia of the external oblique was closed with interrupted #1 Vicryl sutures. And the remainder of the incision was closed in a layered fashion with 2-0 Vicryl sutures 3-0 Monocryl. The small second incision was closed in a similar fashion after it was irrigated as well.  Dry dressings were applied and the patient was  ultimately extubated transferred the PACU that incident. The end of the case all needle sponge counts were correct. There were no adverse intraoperative events. Neuro monitoring remained normal there was no abnormal EMG activity.

## 2016-09-24 NOTE — Anesthesia Postprocedure Evaluation (Signed)
Anesthesia Post Note  Patient: Brittany Ashley  Procedure(s) Performed: Procedure(s) (LRB): XLIF L3-4 (N/A)     Patient location during evaluation: PACU Anesthesia Type: General Level of consciousness: awake and alert Pain management: pain level controlled Vital Signs Assessment: post-procedure vital signs reviewed and stable Respiratory status: spontaneous breathing, nonlabored ventilation and respiratory function stable Cardiovascular status: blood pressure returned to baseline and stable Postop Assessment: no signs of nausea or vomiting Anesthetic complications: no    Last Vitals:  Vitals:   09/24/16 1204 09/24/16 1219  BP: (!) 142/75 (!) 147/84  Pulse: 66 62  Resp: 18 15  Temp:    SpO2: 100% 100%    Last Pain:  Vitals:   09/24/16 1206  TempSrc:   PainSc: 7                  Merica Prell,W. EDMOND

## 2016-09-24 NOTE — Transfer of Care (Signed)
Immediate Anesthesia Transfer of Care Note  Patient: Brittany Ashley  Procedure(s) Performed: Procedure(s) with comments: XLIF L3-4 (N/A) - 3.5 hrs  Patient Location: PACU  Anesthesia Type:General  Level of Consciousness: awake, alert  and oriented  Airway & Oxygen Therapy: Patient connected to face mask oxygen  Post-op Assessment: Post -op Vital signs reviewed and stable  Post vital signs: stable  Last Vitals:  Vitals:   09/24/16 0641  BP: (!) 156/75  Pulse: 68  Resp: 18  Temp: 36.5 C  SpO2: 99%    Last Pain:  Vitals:   09/24/16 0641  TempSrc: Oral         Complications: No apparent anesthesia complications

## 2016-09-24 NOTE — Brief Op Note (Signed)
09/24/2016  10:44 AM  PATIENT:  Brittany Ashley  76 y.o. female  PRE-OPERATIVE DIAGNOSIS:  Lumbar degenerative scoliosis with spinal stenosis  POST-OPERATIVE DIAGNOSIS:  Lumbar degenerative scoliosis with spinal stenosis  PROCEDURE:  Procedure(s) with comments: XLIF L3-4 (N/A) - 3.5 hrs  SURGEON:  Surgeon(s) and Role:    Melina Schools, MD - Primary  PHYSICIAN ASSISTANT:   ASSISTANTS: Carmen Mayo   ANESTHESIA:   general  EBL:  Total I/O In: 1000 [I.V.:1000] Out: 700 [Urine:600; Blood:100]  BLOOD ADMINISTERED:none  DRAINS: none   LOCAL MEDICATIONS USED:  MARCAINE     SPECIMEN:  No Specimen  DISPOSITION OF SPECIMEN:  N/A  COUNTS:  YES  TOURNIQUET:  * No tourniquets in log *  DICTATION: .Dragon Dictation  PLAN OF CARE: Admit to inpatient   PATIENT DISPOSITION:  PACU - hemodynamically stable.

## 2016-09-24 NOTE — Discharge Instructions (Signed)
Spinal Fusion, Care After °These instructions give you information about caring for yourself after your procedure. Your doctor may also give you more specific instructions. Call your doctor if you have any problems or questions after your procedure. °Follow these instructions at home: °Medicines °· Take over-the-counter and prescription medicines only as told by your doctor. These include any medicines for pain. °· Do not drive for 24 hours if you received a sedative. °· Do not drive or use heavy machinery while taking prescription pain medicine. °· If you were prescribed an antibiotic medicine, take it as told by your doctor. Do not stop taking the antibiotic even if you start to feel better. °Surgical Cut (Incision) Care °· Follow instructions from your doctor about how to take care of your surgical cut. Make sure you: °? Wash your hands with soap and water before you change your bandage (dressing). If you cannot use soap and water, use hand sanitizer. °? Change your bandage as told by your doctor. °? Leave stitches (sutures), skin glue, or skin tape (adhesive) strips in place. They may need to stay in place for 2 weeks or longer. If tape strips get loose and curl up, you may trim the loose edges. Do not remove tape strips completely unless your doctor says it is okay. °· Keep your surgical cut clean and dry. Do not take baths, swim, or use a hot tub until your doctor says it is okay. °· Check your surgical cut and the area around it every day for: °? Redness. °? Swelling. °? Fluid. °Physical Activity °· Return to your normal activities as told by your doctor. Ask your doctor what activities are safe for you. Rest and protect your back as much as you can. °· Follow instructions from your doctor about how to move. Use good posture to help your spine heal. °· Do not lift anything that is heavier than 8 lb (3.6 kg) or as told by your doctor until he or she says that it is safe. Do not lift anything over your  head. °· Do not twist or bend at the waist until your doctor says it is okay. °· Avoid pushing or pulling motions. °· Do not sit or lie down in the same position for long periods of time. °· Do not start to exercise until your doctor says it is okay. Ask your doctor what kinds of exercise you can do to make your back stronger. °General instructions °· If you were given a brace, use it as told by your doctor. °· Wear compression stockings as told by your doctor. °· Do not use tobacco products. These include cigarettes, chewing tobacco, or e-cigarettes. If you need help quitting, ask your doctor. °· Keep all follow-up visits as told by your doctor. This is important. This includes any visits with your physical therapist, if this applies. °Contact a doctor if: °· Your pain gets worse. °· Your medicine does not help your pain. °· Your legs or feet become painful or swollen. °· Your surgical cut is red, swollen, or painful. °· You have fluid, blood, or pus coming from your surgical cut. °· You feel sick to your stomach (nauseous). °· You throw up (vomit). °· Your have weakness or loss of feeling (numbness) in your legs that is new or getting worse. °· You have a fever. °· You have trouble controlling when you pee (urinate) or poop (have a bowel movement). °Get help right away if: °· Your pain is very bad. °· You have   chest pain. °· You have trouble breathing. °· You start to have a cough. °These symptoms may be an emergency. Do not wait to see if the symptoms will go away. Get medical help right away. Call your local emergency services (911 in the [place] Do not drive yourself to the hospital. °This information is not intended to replace advice given to you by your health care provider. Make sure you discuss any questions you have with your health care provider. °Document Released: 05/02/2010 Document Revised: 09/04/2015 Document Reviewed: 06/21/2014 °Elsevier Interactive Patient Education © 2018 [employer] ° °

## 2016-09-24 NOTE — Evaluation (Signed)
Physical Therapy Evaluation Patient Details Name: Brittany Ashley MRN: [id number] DOB: 03-24-1940 Today's Date: 09/24/2016   History of Present Illness  Pt is a s/p L3-4 ALIF. PMH includes scoliosis and arthritis.  Clinical Impression  Pt is s/p surgery above with deficits below. PTA, pt reports she uses a walking stick to walk longer distances, but did walk regularly when at home. Upon eval, pt limited by nausea/vomiting, and post op pain. Limited to bed mobility this session, as pt had episode of nausea/vomiting upon sitting, which did not resolve until pt returned to supine. Pt did report LLE pain when moving to sitting which she experienced at baseline. Pt reports family will be able to assist as needed upon d/c. Will continue to follow and update recommendations as pt progresses.     Follow Up Recommendations No PT follow up;Supervision for mobility/OOB    Equipment Recommendations  3in1 (PT)    Recommendations for Other Services       Precautions / Restrictions Precautions Precautions: Back Precaution Booklet Issued: Yes (comment) Precaution Comments: Reviewed back precautions with pt.  Required Braces or Orthoses: Spinal Brace Spinal Brace: Thoracolumbosacral orthotic;Applied in sitting position Restrictions Weight Bearing Restrictions: No      Mobility  Bed Mobility Overal bed mobility: Needs Assistance Bed Mobility: Rolling;Sidelying to Sit;Sit to Sidelying Rolling: Min guard Sidelying to sit: Min guard     Sit to sidelying: Min guard General bed mobility comments: Min guard for safety. Verbal cuse of log roll technique. Upon sitting, pt with episode of nausea and vomiting. Pt requesting to lie back down. did report same pain in LLE during transition to sitting.   Transfers                 General transfer comment: Deferred secondary to nausea/vomiting.   Ambulation/Gait                Stairs            Wheelchair Mobility    Modified Rankin  (Stroke Patients Only)       Balance Overall balance assessment: Needs assistance Sitting-balance support: No upper extremity supported;Feet supported Sitting balance-Leahy Scale: Good                                       Pertinent Vitals/Pain Pain Assessment: 0-10 Pain Score: 6  Pain Location: L side where incision is  Pain Descriptors / Indicators: Aching;Operative site guarding Pain Intervention(s): Limited activity within patient's tolerance;Monitored during session;Repositioned    Home Living Family/patient expects to be discharged to:: Private residence Living Arrangements: Spouse/significant other Available Help at Discharge: Family;Available 24 hours/day Type of Home: House Home Access: Stairs to enter Entrance Stairs-Rails: None Entrance Stairs-Number of Steps: 2 Home Layout: Two level;Able to live on main level with bedroom/bathroom Home Equipment: [employer] [place] - single point (walking stick )      Prior Function Level of Independence: Independent with assistive device(s)         Comments: Used walking stick for long ambulation distances.     Hand Dominance        Extremity/Trunk Assessment   Upper Extremity Assessment Upper Extremity Assessment: Defer to OT evaluation    Lower Extremity Assessment Lower Extremity Assessment: Generalized weakness    Cervical / Trunk Assessment Cervical / Trunk Assessment: Other exceptions Cervical / Trunk Exceptions: s/p ALIF   Communication   Communication: No difficulties  Cognition  Arousal/Alertness: Awake/alert Behavior During Therapy: WFL for tasks assessed/performed Overall Cognitive Status: Within Functional Limits for tasks assessed                                        General Comments General comments (skin integrity, edema, etc.): Reviewed how to put on brace with pt. Reviewed exercise program with pt as well.     Exercises     Assessment/Plan    PT Assessment  Patient needs continued PT services  PT Problem List Decreased strength;Decreased activity tolerance;Decreased balance;Decreased mobility;Decreased knowledge of precautions;Pain       PT Treatment Interventions DME instruction;Gait training;Stair training;Functional mobility training;Therapeutic activities;Therapeutic exercise;Balance training;Neuromuscular re-education;Patient/family education    PT Goals (Current goals can be found in the Care Plan section)  Acute Rehab PT Goals Patient Stated Goal: to feel better  PT Goal Formulation: With patient Time For Goal Achievement: 10/01/16 Potential to Achieve Goals: Good    Frequency Min 5X/week   Barriers to discharge        Co-evaluation               AM-PAC PT "6 Clicks" Daily Activity  Outcome Measure Difficulty turning over in bed (including adjusting bedclothes, sheets and blankets)?: Unable Difficulty moving from lying on back to sitting on the side of the bed? : Unable Difficulty sitting down on and standing up from a chair with arms (e.g., wheelchair, bedside commode, etc,.)?: Unable Help needed moving to and from a bed to chair (including a wheelchair)?: A Little Help needed walking in hospital room?: A Little Help needed climbing 3-5 steps with a railing? : A Lot 6 Click Score: 11    End of Session   Activity Tolerance: Treatment limited secondary to medical complications (Comment) (nausea/vomiting ) Patient left: in bed;with call bell/phone within reach;with family/visitor present Nurse Communication: Mobility status;Other (comment) (nausea/vomiting ) PT Visit Diagnosis: Other abnormalities of gait and mobility (R26.89);Pain Pain - part of body:  (L side at incision site )    Time: 3734-2876 PT Time Calculation (min) (ACUTE ONLY): 24 min   Charges:   PT Evaluation $PT Eval Moderate Complexity: 1 Mod PT Treatments $Therapeutic Activity: 8-22 mins   PT G Codes:        Leighton Ruff, PT, DPT   Acute Rehabilitation Services  Pager: [phone]   Rudean Hitt 09/24/2016, 4:15 PM

## 2016-09-25 ENCOUNTER — Inpatient Hospital Stay (HOSPITAL_COMMUNITY): Payer: Medicare Other

## 2016-09-25 ENCOUNTER — Encounter (HOSPITAL_COMMUNITY)
Admission: RE | Disposition: A | Discharge status: Home / Self Care | Payer: Self-pay | Source: Ambulatory Visit | Attending: Orthopedic Surgery

## 2016-09-25 ENCOUNTER — Encounter (HOSPITAL_COMMUNITY): Payer: Self-pay | Admitting: Orthopedic Surgery

## 2016-09-25 ENCOUNTER — Inpatient Hospital Stay (HOSPITAL_COMMUNITY): Admission: RE | Admit: 2016-09-25 | Payer: Medicare Other | Source: Ambulatory Visit | Admitting: Orthopedic Surgery

## 2016-09-25 SURGERY — POSTERIOR LUMBAR FUSION 3 LEVEL
Anesthesia: General

## 2016-09-25 MED FILL — Thrombin For Soln 20000 Unit: CUTANEOUS | Qty: 1 | Status: AC

## 2016-09-25 NOTE — Progress Notes (Signed)
Patient reports significant radicular leg pain relief Ambulating independently without significant pain No focal deficits She has decided not to move forward with posterior decompression Given pain relief and improved mobility I agree with the decision. Will plan on D/C today or in the AM Will check xrays this AM

## 2016-09-25 NOTE — Evaluation (Signed)
Occupational Therapy Evaluation Patient Details Name: Brittany Ashley MRN: [id number] DOB: 11-22-1940 Today's Date: 09/25/2016    History of Present Illness Pt is a s/p L3-4 ALIF. PMH includes scoliosis and arthritis.   Clinical Impression   Pt admitted with the above diagnoses and presents with below problem list. Pt will benefit from continued acute OT to address the below listed deficits and maximize independence with basic ADLs prior to d/c home. PTA pt was independent with ADLs. Pt is currently supervision to min guard with functional transfers/mobility and min A with LB ADLs.      Follow Up Recommendations  No OT follow up    Equipment Recommendations  3 in 1 bedside commode    Recommendations for Other Services       Precautions / Restrictions Precautions Precautions: Back Precaution Comments: reviewed Required Braces or Orthoses: Spinal Brace Spinal Brace: Thoracolumbosacral orthotic;Applied in sitting position Restrictions Weight Bearing Restrictions: No      Mobility Bed Mobility Overal bed mobility: Needs Assistance Bed Mobility: Sit to Sidelying;Rolling Rolling: Supervision       Sit to sidelying: Min assist General bed mobility comments: EOB/OOB  Transfers Overall transfer level: Needs assistance Equipment used: None Transfers: Sit to/from Stand Sit to Stand: Min guard         General transfer comment: increased time, min guard for safety    Balance Overall balance assessment: Needs assistance Sitting-balance support: No upper extremity supported;Feet supported Sitting balance-Leahy Scale: Good     Standing balance support: During functional activity;Bilateral upper extremity supported;No upper extremity supported Standing balance-Leahy Scale: Fair                             ADL either performed or assessed with clinical judgement   ADL Overall ADL's : Needs assistance/impaired Eating/Feeding: Set up;Sitting   Grooming:  Supervision/safety;Standing;Set up   Upper Body Bathing: Set up;Sitting   Lower Body Bathing: Minimal assistance;Sit to/from stand   Upper Body Dressing : Set up;Sitting   Lower Body Dressing: Minimal assistance;Sit to/from stand   Toilet Transfer: Supervision/safety;Ambulation   Toileting- Clothing Manipulation and Hygiene: Supervision/safety;Sit to/from stand   Tub/ Shower Transfer: Walk-in shower;Min guard;Ambulation;3 in 1   Functional mobility during ADLs: Supervision/safety General ADL Comments: Pt completed UB/LB dressing, toilet transfer and grooming task at sink as detailed above. Pt with difficulty elevating feet for LB ADLs. Discussed AE and technique to avoid bending.  Discussed DME.      Vision         Perception     Praxis      Pertinent Vitals/Pain Pain Assessment: Faces Pain Score: 4  Faces Pain Scale: Hurts little more Pain Location: incision site Pain Descriptors / Indicators: Aching;Operative site guarding Pain Intervention(s): Limited activity within patient's tolerance;Repositioned;Monitored during session     Hand Dominance     Extremity/Trunk Assessment Upper Extremity Assessment Upper Extremity Assessment: Overall WFL for tasks assessed   Lower Extremity Assessment Lower Extremity Assessment: Defer to PT evaluation   Cervical / Trunk Assessment Cervical / Trunk Assessment: Other exceptions Cervical / Trunk Exceptions: s/p ALIF    Communication Communication Communication: No difficulties   Cognition Arousal/Alertness: Awake/alert Behavior During Therapy: WFL for tasks assessed/performed Overall Cognitive Status: Within Functional Limits for tasks assessed  General Comments       Exercises     Shoulder Instructions      Home Living Family/patient expects to be discharged to:: Private residence Living Arrangements: Spouse/significant other Available Help at Discharge:  Family;Available 24 hours/day Type of Home: House Home Access: Stairs to enter [employer] of Steps: 2 Entrance Stairs-Rails: None Home Layout: Two level;Able to live on main level with bedroom/bathroom     Bathroom Shower/Tub: Occupational psychologist: Handicapped height     Home Equipment: [place] - single point (walking stick )          Prior Functioning/Environment Level of Independence: Independent with assistive device(s)        Comments: Used walking stick for long ambulation distances.        OT Problem List: Impaired balance (sitting and/or standing);Decreased knowledge of use of DME or AE;Decreased knowledge of precautions;Pain      OT Treatment/Interventions: Self-care/ADL training;DME and/or AE instruction;Visual/perceptual remediation/compensation;Patient/family education;Therapeutic activities    OT Goals(Current goals can be found in the care plan section) Acute Rehab OT Goals Patient Stated Goal: to feel better  OT Goal Formulation: With patient Time For Goal Achievement: 10/02/16 Potential to Achieve Goals: Good ADL Goals Pt Will Perform Lower Body Bathing: with modified independence;with adaptive equipment;sit to/from stand Pt Will Perform Lower Body Dressing: with modified independence;with adaptive equipment;sit to/from stand Pt Will Perform Tub/Shower Transfer: Shower transfer;with supervision;ambulating;3 in 1  OT Frequency: Min 2X/week   Barriers to D/C:            Co-evaluation              AM-PAC PT "6 Clicks" Daily Activity     Outcome Measure Help from another person eating meals?: None Help from another person taking care of personal grooming?: None Help from another person toileting, which includes using toliet, bedpan, or urinal?: None Help from another person bathing (including washing, rinsing, drying)?: A Little Help from another person to put on and taking off regular upper body clothing?: None Help from  another person to put on and taking off regular lower body clothing?: A Little 6 Click Score: 22   End of Session Equipment Utilized During Treatment: Back brace  Activity Tolerance: Patient tolerated treatment well Patient left: with call bell/phone within reach;Other (comment) (at sink)  OT Visit Diagnosis: Pain                Time: 9622-2979 OT Time Calculation (min): 20 min Charges:  OT General Charges $OT Visit: 1 Visit OT Evaluation $OT Eval Low Complexity: 1 Low G-Codes:       Hortencia Pilar 09/25/2016, 11:08 AM

## 2016-09-25 NOTE — Progress Notes (Signed)
    Subjective: Procedure(s) (LRB): Posterior decompression insitu fusion L2-5 (N/A) Day of Surgery  Patient reports pain as 2 on 0-10 scale.  Reports decreased leg pain reports incisional back pain   Foley in place Negative bowel movement Positive flatus Negative chest pain or shortness of breath  Objective: Vital signs in last 24 hours: Temp:  [97.6 F (36.4 C)-98.4 F (36.9 C)] 98.4 F (36.9 C) (09/06 0400) Pulse Rate:  [62-83] 83 (09/06 0400) Resp:  [15-33] 16 (09/06 0400) BP: (109-150)/(63-84) 123/71 (09/06 0400) SpO2:  [94 %-100 %] 99 % (09/06 0400)  Intake/Output from previous day: 09/05 0701 - 09/06 0700 In: 1609.8 [P.O.:240; I.V.:1369.8] Out: 1900 [Urine:1800; Blood:100]  Labs: No results for input(s): WBC, RBC, HCT, PLT in the last 72 hours. No results for input(s): NA, K, CL, CO2, BUN, CREATININE, GLUCOSE, CALCIUM in the last 72 hours. No results for input(s): LABPT, INR in the last 72 hours.  Physical Exam: Neurologically intact ABD soft Intact pulses distally Incision: dressing C/D/I Compartment soft  Assessment/Plan: Patient stable  xrays n/a Continue mobilization with physical therapy Continue care  Patient ambulated yesterday with less neuropathic pain.  Reports brief episode of thigh/leg pain consistent with neurogenic pain.   Incisional pain is mild-moderate. Will ambulate again and she will consider whether she would like to move forward with posterior decompression and insitu fusion today  Melina Schools, MD [place] [phone]

## 2016-09-25 NOTE — Progress Notes (Signed)
Pt received 3-n-1 from [facility] prior to D/C. Chuong Casebeer, RN  

## 2016-09-25 NOTE — Progress Notes (Signed)
Pt doing well. Pt and daughter given D/C instructions with verbal understanding. Rx's were sent to pharmacy by MD. Pt's incision is stained but has no sign of infection. Pt's IV was removed prior to D/C. Pt D/C'd home via wheelchair per MD order. Pt is stable @ D/C and has no other needs at this time. Holli Humbles, RN

## 2016-09-25 NOTE — Anesthesia Preprocedure Evaluation (Signed)
Anesthesia Evaluation  Patient identified by MRN, date of birth, ID band Patient awake    Reviewed: Allergy & Precautions, H&P , NPO status , Patient's Chart, lab work & pertinent test results  Airway Mallampati: II  TM Distance: >3 FB Neck ROM: Full    Dental no notable dental hx. (+) Teeth Intact, Dental Advisory Given   Pulmonary neg pulmonary ROS,    Pulmonary exam normal breath sounds clear to auscultation       Cardiovascular Exercise Tolerance: Good negative cardio ROS   Rhythm:Regular Rate:Normal     Neuro/Psych negative neurological ROS  negative psych ROS   GI/Hepatic Neg liver ROS, GERD  Medicated and Controlled,  Endo/Other  Hypothyroidism   Renal/GU negative Renal ROS  negative genitourinary   Musculoskeletal  (+) Arthritis , Osteoarthritis,    Abdominal   Peds  Hematology negative hematology ROS (+)   Anesthesia Other Findings - Echo 04/09/10: Summary: Normal left ventricular systolic function with mild concentric LVH. LVEF 70%. E/A flow reversal was suggestive of diastolic dysfunction. Mitral annular calcification with mild mitral insufficiency. Aortic sclerosis with mild aortic insufficiency. Mild tricuspid regurgitation with normal pulmonary artery pressures.  - Exercise myocardial perfusion study 03/10/13: Post stress gated SPECT was performed and showed normal wall motion and thickening with ejection fraction calculated 68%. Tomographic images at rest and stress showed no evidence of ischemia. Soft tissue attenuation noted in the inferior wall of the LV on the scan. Patient achieved 88% of maximal predicted heart rate. Good exercise capacity. Resting EKG showed normal sinus rhythm and was within normal limits. There were no ECG changes with exercise.  Conclusion: Negative exercise perfusion stress test.    Reproductive/Obstetrics negative OB ROS                              Anesthesia Physical  Anesthesia Plan  ASA: III  Anesthesia Plan: General   Post-op Pain Management:    Induction: Intravenous  PONV Risk Score and Plan: 4 or greater and Ondansetron, Dexamethasone, Midazolam and Treatment may vary due to age or medical condition  Airway Management Planned: Oral ETT  Additional Equipment:   Intra-op Plan:   Post-operative Plan: Extubation in OR  Informed Consent: I have reviewed the patients History and Physical, chart, labs and discussed the procedure including the risks, benefits and alternatives for the proposed anesthesia with the patient or authorized representative who has indicated his/her understanding and acceptance.   Dental advisory given  Plan Discussed with: CRNA  Anesthesia Plan Comments:         Anesthesia Quick Evaluation

## 2016-09-25 NOTE — Progress Notes (Signed)
Physical Therapy Treatment Patient Details Name: Brittany Ashley MRN: [id number] DOB: 03/20/1940 Today's Date: 09/25/2016    History of Present Illness Pt is a s/p L3-4 ALIF. PMH includes scoliosis and arthritis.    PT Comments    Pt making good progress with mobility and tolerated ambulating in hallway with min guard and use of RW. PT reviewed 3/3 back precautions with pt and pt's daughter throughout. PT will continue to follow pt acutely to ensure a safe d/c home.    Follow Up Recommendations  No PT follow up;Supervision for mobility/OOB     Equipment Recommendations  3in1 (PT)    Recommendations for Other Services       Precautions / Restrictions Precautions Precautions: Back Precaution Comments: reviewed 3/3 back precautions with pt Required Braces or Orthoses: Spinal Brace Spinal Brace: Thoracolumbosacral orthotic;Applied in sitting position Restrictions Weight Bearing Restrictions: No    Mobility  Bed Mobility Overal bed mobility: Needs Assistance Bed Mobility: Sit to Sidelying;Rolling Rolling: Supervision       Sit to sidelying: Min assist General bed mobility comments: increased time, assist to return bilateral LEs to bed  Transfers Overall transfer level: Needs assistance Equipment used: 1 person hand held assist Transfers: Sit to/from Stand Sit to Stand: Min guard         General transfer comment: increased time, min guard for safety  Ambulation/Gait Ambulation/Gait assistance: Min guard Ambulation Distance (Feet): 300 Feet Assistive device: Rolling walker (2 wheeled) Gait Pattern/deviations: Step-through pattern;Decreased step length - right;Decreased step length - left;Decreased stride length Gait velocity: decreased Gait velocity interpretation: Below normal speed for age/gender General Gait Details: slow, cautious gait pattern; min guard for safety   Stairs            Wheelchair Mobility    Modified Rankin (Stroke Patients Only)        Balance Overall balance assessment: Needs assistance Sitting-balance support: No upper extremity supported;Feet supported Sitting balance-Leahy Scale: Good     Standing balance support: During functional activity;Bilateral upper extremity supported Standing balance-Leahy Scale: Poor                              Cognition Arousal/Alertness: Awake/alert Behavior During Therapy: WFL for tasks assessed/performed Overall Cognitive Status: Within Functional Limits for tasks assessed                                        Exercises      General Comments        Pertinent Vitals/Pain Pain Assessment: 0-10 Pain Score: 4  Pain Location: incision site Pain Descriptors / Indicators: Aching;Operative site guarding Pain Intervention(s): Monitored during session;Repositioned    Home Living                      Prior Function            PT Goals (current goals can now be found in the care plan section) Acute Rehab PT Goals PT Goal Formulation: With patient Time For Goal Achievement: 10/01/16 Potential to Achieve Goals: Good Progress towards PT goals: Progressing toward goals    Frequency    Min 5X/week      PT Plan Current plan remains appropriate    Co-evaluation              AM-PAC PT "6 Clicks" Daily Activity  Outcome Measure  Difficulty turning over in bed (including adjusting bedclothes, sheets and blankets)?: A Lot Difficulty moving from lying on back to sitting on the side of the bed? : Unable Difficulty sitting down on and standing up from a chair with arms (e.g., wheelchair, bedside commode, etc,.)?: Unable Help needed moving to and from a bed to chair (including a wheelchair)?: A Little Help needed walking in hospital room?: A Little Help needed climbing 3-5 steps with a railing? : A Little 6 Click Score: 13    End of Session Equipment Utilized During Treatment: Gait belt;Back brace Activity Tolerance:  Patient tolerated treatment well Patient left: in bed;with call bell/phone within reach;with family/visitor present Nurse Communication: Mobility status PT Visit Diagnosis: Other abnormalities of gait and mobility (R26.89);Pain Pain - part of body:  (back)     Time: 6759-1638 PT Time Calculation (min) (ACUTE ONLY): 18 min  Charges:  $Gait Training: 8-22 mins                    G Codes:       [place], [place], [employer] 09/25/2016, 9:24 AM

## 2016-09-29 ENCOUNTER — Encounter (HOSPITAL_COMMUNITY): Payer: Self-pay | Admitting: Orthopedic Surgery

## 2016-10-07 NOTE — Discharge Summary (Signed)
Physician Discharge Summary  Patient ID: Brittany Ashley MRN: [id number] DOB/AGE: 1940/02/06 76 y.o.  Admit date: 09/24/2016 Discharge date: 09/25/16  Admission Diagnoses:  Degenerative Disc Disease/ Scoliosis  Discharge Diagnoses:  Active Problems:   Status post lumbar spine operation   Status post lumbar spinal fusion   Past Medical History:  Diagnosis Date  . Benign neoplasm of colon   . Coronary atherosclerosis    coronary atherosclerosis (documented in history at [facility]); mild coronary calcification 03/29/13 chest CT--referred to cardiology by Dr. Bea Graff and had stress test with reportedly PRN f/u  . DDD (degenerative disc disease), lumbar   . GERD (gastroesophageal reflux disease)   . Hyperlipidemia   . Hyperopia with astigmatism and presbyopia, bilateral   . Hypothyroidism   . Nephrolithiasis   . Nontoxic thyroid nodule   . OA (osteoarthritis)   . Osteopenia   . Pneumonia   . Rosacea   . Scoliosis of lumbar spine    lumbar degenerative scoliosis with spinal stenosis  . Vitamin B deficiency   . Vitamin D deficiency   . Wears contact lenses    and glasses    Surgeries: Procedure(s): XLIF L3-4 on 09/24/2016   Consultants (if any):   Discharged Condition: Improved  Hospital Course: Brittany Ashley is an 76 y.o. female who was admitted 09/24/2016 with a diagnosis of Degenerative Disc Disease and scoliosis and went to the operating room on 09/24/2016 and underwent the above named procedures.  Post op day one pt reported decrease leg pain.  Incisional pain was controlled on oral medication.  Pt was urinating w/o fifficulty.  Pt was ambulating in hallway.  Pt was cleared by PT for DC.   She was given perioperative antibiotics:  Anti-infectives    Start     Dose/Rate Route Frequency Ordered Stop   09/24/16 1430  ceFAZolin (ANCEF) IVPB 2g/100 mL premix     2 g 200 mL/hr over 30 Minutes Intravenous Every 8 hours 09/24/16 1247 09/24/16 2320   09/24/16 0654   ceFAZolin (ANCEF) IVPB 2g/100 mL premix     2 g 200 mL/hr over 30 Minutes Intravenous 30 min pre-op 09/24/16 0654 09/24/16 0910   09/24/16 0653  ceFAZolin (ANCEF) IVPB 2g/100 mL premix  Status:  Discontinued     2 g 200 mL/hr over 30 Minutes Intravenous 30 min pre-op 09/24/16 0653 09/24/16 0654    .  She was given sequential compression devices, early ambulation, and TED for DVT prophylaxis.  She benefited maximally from the hospital stay and there were no complications.    Recent vital signs:  Vitals:   09/25/16 0748 09/25/16 1226  BP: (!) 113/56 111/80  Pulse: 65 82  Resp: 18 18  Temp: 98.3 F (36.8 C) 98.2 F (36.8 C)  SpO2: 97% 100%    Recent laboratory studies:  Lab Results  Component Value Date   HGB 13.1 09/17/2016   Lab Results  Component Value Date   WBC 6.3 09/17/2016   PLT 196 09/17/2016   No results found for: INR Lab Results  Component Value Date   NA 139 09/17/2016   K 3.9 09/17/2016   CL 108 09/17/2016   CO2 24 09/17/2016   BUN 12 09/17/2016   CREATININE 0.76 09/17/2016   GLUCOSE 99 09/17/2016    Discharge Medications:   Allergies as of 09/25/2016   No Known Allergies     Medication List    TAKE these medications   acetaminophen 500 MG tablet Commonly known as:  TYLENOL Take 1,000 mg by mouth every 8 (eight) hours as needed for mild pain (takes every morning and again if needed for pain).   ALIGN 4 MG Caps Take 1 capsule by mouth daily.   aspirin EC 81 MG tablet Take 81 mg by mouth daily.   B-12 COMPLIANCE INJECTION 1000 MCG/ML Kit Generic drug:  Cyanocobalamin Inject 1 mL as directed every 7 (seven) days. Saturdays   cholecalciferol 1000 units tablet Commonly known as:  VITAMIN D Take 1,000 Units by mouth daily.   levothyroxine 50 MCG tablet Commonly known as:  SYNTHROID, LEVOTHROID Take 50 mcg by mouth daily before breakfast.   omeprazole 20 MG capsule Commonly known as:  PRILOSEC Take 20 mg by mouth daily.   rosuvastatin  5 MG tablet Commonly known as:  CRESTOR Take 5 mg by mouth daily.            Discharge Care Instructions        Start     Ordered   09/24/16 0000  Incentive spirometry RT     09/24/16 1116      Diagnostic Studies: Dg Lumbar Spine 2-3 Views  Result Date: 09/25/2016 CLINICAL DATA:  Status post lateral interbody fusion at L3-4 yesterday. EXAM: LUMBAR SPINE - 2-3 VIEW COMPARISON:  AP and lateral intraoperative fluoro spot views. FINDINGS: The interbody fusion appliances appear stable in position. The vertebral bodies are preserved in height. The pedicles are intact. There is disc space narrowing at T12-L1 and L1-L2. IMPRESSION: No postprocedure complication following lateral interbody fusion at L3-4. Electronically Signed   By: David  Martinique M.D.   On: 09/25/2016 10:08   Dg Lumbar Spine 2-3 Views  Result Date: 09/24/2016 CLINICAL DATA:  Spinal stenosis. EXAM: LUMBAR SPINE - 2-3 VIEW; DG C-ARM 61-120 MIN COMPARISON:  None. FINDINGS: AP and lateral C-arm images demonstrate the patient has undergone lateral interbody fusion at L3-4. FLUOROSCOPY TIME:  2 minutes 13 seconds C-arm fluoroscopic images were obtained intraoperatively and submitted for post operative interpretation. IMPRESSION: Lateral interbody fusion performed at L3-4. Electronically Signed   By: Lorriane Shire M.D.   On: 09/24/2016 10:36   Dg C-arm 61-120 Min  Result Date: 09/24/2016 CLINICAL DATA:  Spinal stenosis. EXAM: LUMBAR SPINE - 2-3 VIEW; DG C-ARM 61-120 MIN COMPARISON:  None. FINDINGS: AP and lateral C-arm images demonstrate the patient has undergone lateral interbody fusion at L3-4. FLUOROSCOPY TIME:  2 minutes 13 seconds C-arm fluoroscopic images were obtained intraoperatively and submitted for post operative interpretation. IMPRESSION: Lateral interbody fusion performed at L3-4. Electronically Signed   By: Lorriane Shire M.D.   On: 09/24/2016 10:36    Disposition: 01-Home or Self Care Pt will present to clinic in 2  weeks Pt was provided post op medications  Discharge Instructions    Incentive spirometry RT    Complete by:  As directed          Signed: Valinda Hoar 10/07/2016, 12:12 PM

## 2016-10-21 ENCOUNTER — Encounter (HOSPITAL_COMMUNITY): Payer: Self-pay | Admitting: Orthopedic Surgery

## 2017-12-28 ENCOUNTER — Ambulatory Visit (HOSPITAL_COMMUNITY): Payer: Self-pay | Admitting: Orthopedic Surgery

## 2018-01-18 NOTE — Pre-Procedure Instructions (Signed)
Brittany Ashley  01/18/2018      [id number], [place] AT [address] [place] [address] Phone: [phone] Fax: [phone]    Your procedure is scheduled on Wednesday, January 8th.  Report to [facility] Admitting at 6:30 A.M.  Call this number if you have problems the morning of surgery:  [phone]   Remember:  Do not eat or drink after midnight.    Take these medicines the morning of surgery with A SIP OF WATER  acetaminophen (TYLENOL)-as needed. levothyroxine (SYNTHROID, LEVOTHROID)  omeprazole (PRILOSEC)  7 days prior to surgery (01/20/18), STOP taking any Aspirin (unless otherwise instructed by your surgeon), Aleve, Naproxen, Ibuprofen, Motrin, Advil, Goody's, BC's, all herbal medications, fish oil, and all vitamins.     Do not wear jewelry, make-up or nail polish.  Do not wear lotions, powders, or perfumes, or deodorant.  Do not shave 48 hours prior to surgery.    Do not bring valuables to the hospital.  [facility] is not responsible for any belongings or valuables.  Contacts, eyeglasses, hearing aids, dentures or bridgework may not be worn into surgery.  Leave your suitcase in the car.  After surgery it may be brought to your room.  For patients admitted to the hospital, discharge time will be determined by your treatment team.  Patients discharged the day of surgery will not be allowed to drive home.   Special instructions:   [facility]- Preparing For Surgery  Before surgery, you can play an important role. Because skin is not sterile, your skin needs to be as free of germs as possible. You can reduce the number of germs on your skin by washing with CHG (chlorahexidine gluconate) Soap before surgery.  CHG is an antiseptic cleaner which kills germs and bonds with the skin to continue killing germs even after washing.    Oral Hygiene is also important to reduce your risk  of infection.  Remember - BRUSH YOUR TEETH THE MORNING OF SURGERY WITH YOUR REGULAR TOOTHPASTE  Please do not use if you have an allergy to CHG or antibacterial soaps. If your skin becomes reddened/irritated stop using the CHG.  Do not shave (including legs and underarms) for at least 48 hours prior to first CHG shower. It is OK to shave your face.  Please follow these instructions carefully.   1. Shower the NIGHT BEFORE SURGERY and the MORNING OF SURGERY with CHG.   2. If you chose to wash your hair, wash your hair first as usual with your normal shampoo.  3. After you shampoo, rinse your hair and body thoroughly to remove the shampoo.  4. Use CHG as you would any other liquid soap. You can apply CHG directly to the skin and wash gently with a scrungie or a clean washcloth.   5. Apply the CHG Soap to your body ONLY FROM THE NECK DOWN.  Do not use on open wounds or open sores. Avoid contact with your eyes, ears, mouth and genitals (private parts). Wash Face and genitals (private parts)  with your normal soap.  6. Wash thoroughly, paying special attention to the area where your surgery will be performed.  7. Thoroughly rinse your body with warm water from the neck down.  8. DO NOT shower/wash with your normal soap after using and rinsing off the CHG Soap.  9. Pat yourself dry with  a CLEAN TOWEL.  10. Wear CLEAN PAJAMAS to bed the night before surgery, wear comfortable clothes the morning of surgery  11. Place CLEAN SHEETS on your bed the night of your first shower and DO NOT SLEEP WITH PETS.    Day of Surgery: Shower as stated above. Do not apply any deodorants/lotions.  Please wear clean clothes to the hospital/surgery center.   Remember to brush your teeth WITH YOUR REGULAR TOOTHPASTE.   Please read over the following fact sheets that you were given.

## 2018-01-19 ENCOUNTER — Encounter (HOSPITAL_COMMUNITY): Payer: Self-pay

## 2018-01-19 ENCOUNTER — Other Ambulatory Visit: Payer: Self-pay

## 2018-01-19 ENCOUNTER — Encounter (HOSPITAL_COMMUNITY)
Admission: RE | Admit: 2018-01-19 | Discharge: 2018-01-19 | Disposition: A | Discharge status: Home / Self Care | Payer: Medicare Other | Source: Ambulatory Visit | Attending: Orthopedic Surgery | Admitting: Orthopedic Surgery

## 2018-01-19 DIAGNOSIS — Z01818 Encounter for other preprocedural examination: Secondary | ICD-10-CM | POA: Insufficient documentation

## 2018-01-19 HISTORY — DX: Dyspnea, unspecified: R06.00

## 2018-01-19 LAB — SURGICAL PCR SCREEN
MRSA, PCR: NEGATIVE
Staphylococcus aureus: NEGATIVE

## 2018-01-19 LAB — BASIC METABOLIC PANEL
Anion gap: 9 (ref 5–15)
BUN: 9 mg/dL (ref 8–23)
CALCIUM: 9.6 mg/dL (ref 8.9–10.3)
CO2: 23 mmol/L (ref 22–32)
Chloride: 106 mmol/L (ref 98–111)
Creatinine, Ser: 0.64 mg/dL (ref 0.44–1.00)
GFR calc non Af Amer: >60 mL/min (ref >60)
Glucose, Bld: 99 mg/dL (ref 70–99)
Potassium: 3.8 mmol/L (ref 3.5–5.1)
Sodium: 138 mmol/L (ref 135–145)

## 2018-01-19 LAB — CBC
HCT: 39.8 % (ref 36.0–46.0)
Hemoglobin: 12.7 g/dL (ref 12.0–15.0)
MCH: 30.2 pg (ref 26.0–34.0)
MCHC: 31.9 g/dL (ref 30.0–36.0)
MCV: 94.8 fL (ref 80.0–100.0)
PLATELETS: 202 10*3/uL (ref 150–400)
RBC: 4.2 MIL/uL (ref 3.87–5.11)
RDW: 11.9 % (ref 11.5–15.5)
WBC: 6.2 10*3/uL (ref 4.0–10.5)
nRBC: 0 % (ref 0.0–0.2)

## 2018-01-19 NOTE — Progress Notes (Signed)
PCP: Gilford Rile  Cardiologist: Virgina Organ  DM: denies  SA: denies  Pt denies SOB, cough, fever, chest pain  Pt stated understanding of instructions given for DOS.

## 2018-01-21 NOTE — Anesthesia Preprocedure Evaluation (Addendum)
Anesthesia Evaluation  Patient identified by MRN, date of birth, ID band Patient awake    Reviewed: Allergy & Precautions, NPO status , Patient's Chart, lab work & pertinent test results  History of Anesthesia Complications (+) PONVNegative for: history of anesthetic complications  Airway Mallampati: II  TM Distance: >3 FB Neck ROM: Full    Dental  (+) Dental Advisory Given, Teeth Intact   Pulmonary neg pulmonary ROS,    breath sounds clear to auscultation       Cardiovascular Exercise Tolerance: Good + CAD   Rhythm:Regular Rate:Normal  Echo 04/09/10:Summary: Mild concentric LVH. LVEF 70%. E/A flow reversalwas suggestive of diastolic dysfunction. Mild mitral insufficiency. Aortic sclerosis with mild aortic insufficiency. Mild tricuspid regurgitation.  -Exercise myocardial perfusion study 03/10/13: Post stress gated SPECT was performed and showed normal wall motion and thickening with ejection fraction calculated 68%. Tomographic images at rest and stress showed no evidence of ischemia. Patient achieved 88% of maximal predicted heart rate. Good exercise capacity. Resting EKG showed normal sinus rhythm and was within normal limits. There were no ECG changes with exercise. Negative exercise perfusion stress test.  - Carotid U/S 02/01/14:  No evidence of hemodynamically significant stenosis in bilateral carotid bifurcation.    Neuro/Psych negative neurological ROS  negative psych ROS   GI/Hepatic Neg liver ROS, GERD  Controlled,  Endo/Other  Hypothyroidism   Renal/GU Renal disease     Musculoskeletal  (+) Arthritis , Osteoarthritis,   Scoliosis    Abdominal   Peds  Hematology negative hematology ROS (+)   Anesthesia Other Findings   Reproductive/Obstetrics                            Anesthesia Physical Anesthesia Plan  ASA: II  Anesthesia Plan: General   Post-op Pain Management:     Induction: Intravenous  PONV Risk Score and Plan: 4 or greater and Treatment may vary due to age or medical condition, TIVA, Ondansetron and Dexamethasone  Airway Management Planned: Oral ETT  Additional Equipment: None  Intra-op Plan:   Post-operative Plan: Extubation in OR  Informed Consent: I have reviewed the patients History and Physical, chart, labs and discussed the procedure including the risks, benefits and alternatives for the proposed anesthesia with the patient or authorized representative who has indicated his/her understanding and acceptance.   Dental advisory given  Plan Discussed with: CRNA and Anesthesiologist  Anesthesia Plan Comments:       Anesthesia Quick Evaluation

## 2018-01-21 NOTE — Progress Notes (Signed)
Anesthesia Chart Review:  Case:  [id number] Date/Time:  01/27/18 0815   Procedure:  Lumbar decompression L4-5 (N/A ) - 2.5 hrs   Anesthesia type:  General   Pre-op diagnosis:  Lumbar spinal stenosis L4-5   Location:  [facility] OR ROOM 04 / [facility] OR   Surgeon:  Melina Schools, MD      DISCUSSION: 78 yo female never smoker. Pertinent hx includes GERD, Hypothyroid, Coronary calcifications seen on CT, Occasional DOE.  Pt was seen by PCP Dr Bea Graff 01/06/18 for preop clearance. He advised pt be seen by cardiology prior to surgery due to hx of presyncopal episodes.  Pt was seen 01/06/2018 at [facility] by Dr. Hamilton Capri for cardiac clearance. Per his note: "She is active for her age, she walks 2 miles every morning. She can walk several blocks without difficulty only limited by pain in her back. She denies frank syncope exertional chest pain breathlessness or lower extremity swelling. She does relate several brief atypical type spells of thinking that she may pass out but they pass. This is been going on for a number of years negative extensive the current cardiac evaluation in the past. There has never been any frank syncope falls or type any type of injury. No known history of cardiac disease.Marland KitchenMarland KitchenNonspecific normal variant ECG, normal cardiac exam good functional status despite back problems. No cardiac symptoms no risk procedure. Proceed with elective back surgery from a cardiovascular standpoint no further cardiac evaluation."  Pt did have a prior cardiac workup in in 2015 by Dr Geraldo Pitter with nonischemic exercise stress test. See below for results.  Anticipate she can proceed as planned barring acute status change.  VS: BP (!) 163/78   Pulse 74   Temp 36.6 C   Ht '5\' 4"'  (1.626 m)   Wt 61.1 kg   SpO2 100%   BMI 23.12 kg/m   PROVIDERS: Raina Mina., MD is PCP  Almira Coaster, MD is Cardiologist  LABS: Labs reviewed: Acceptable for surgery. (all labs ordered are listed, but only abnormal results are  displayed)  Labs Reviewed  SURGICAL PCR SCREEN  BASIC METABOLIC PANEL  CBC    IMAGES: CHEST - 2 VIEW 10/28/17 (care everywhere):  COMPARISON: None.  FINDINGS: Heart size and vascularity normal. Lungs are clear without infiltrate effusion or mass. Mild apical scarring bilaterally. Mild scoliosis.  IMPRESSION: No active cardiopulmonary disease.  EKG: 01/19/2018: Normal sinus rhythm. Rate 72. Left ventricular hypertrophy.  CV: Records from [facility] when pt was following with Dr. Jyl Heinz.    - Echo 04/09/10: Summary: Normal left ventricular systolic function with mild concentric LVH. LVEF 70%. E/A flow reversal was suggestive of diastolic dysfunction. Mitral annular calcification with mild mitral insufficiency. Aortic sclerosis with mild aortic insufficiency. Mild tricuspid regurgitation with normal pulmonary artery pressures.  - Exercise myocardial perfusion study 03/10/13: Post stress gated SPECT was performed and showed normal wall motion and thickening with ejection fraction calculated 68%. Tomographic images at rest and stress showed no evidence of ischemia. Soft tissue attenuation noted in the inferior wall of the LV on the scan. Patient achieved 88% of maximal predicted heart rate. Good exercise capacity. Resting EKG showed normal sinus rhythm and was within normal limits. There were no ECG changes with exercise.  Conclusion: Negative exercise perfusion stress test.   - Carotid U/S 02/01/14:  No evidence of hemodynamically significant stenosis in bilateral carotid bifurcation.   Past Medical History:  Diagnosis Date  . Benign neoplasm of colon   . Coronary atherosclerosis  coronary atherosclerosis (documented in history at [facility]); mild coronary calcification 03/29/13 chest CT--referred to cardiology by Dr. Bea Graff and had stress test with reportedly PRN f/u  . DDD (degenerative disc disease), lumbar   . Dyspnea    sometimes upon exertion  .  GERD (gastroesophageal reflux disease)   . Hyperlipidemia   . Hyperopia with astigmatism and presbyopia, bilateral   . Hypothyroidism   . Nephrolithiasis   . Nontoxic thyroid nodule   . OA (osteoarthritis)   . Osteopenia   . Pneumonia   . Rosacea   . Scoliosis of lumbar spine    lumbar degenerative scoliosis with spinal stenosis  . Vitamin B deficiency   . Vitamin D deficiency   . Wears contact lenses    and glasses    Past Surgical History:  Procedure Laterality Date  . ANTERIOR LAT LUMBAR FUSION N/A 09/24/2016   Procedure: XLIF L3-4;  Surgeon: Melina Schools, MD;  Location: [facility];  Service: Orthopedics;  Laterality: N/A;  3.5 hrs  . COLONOSCOPY     biopsy with polypectomy  . FRACTURE SURGERY     right ankle    MEDICATIONS: . acetaminophen (TYLENOL) 500 MG tablet  . Cyanocobalamin (B-12 COMPLIANCE INJECTION) 1000 MCG/ML KIT  . levothyroxine (SYNTHROID, LEVOTHROID) 25 MCG tablet  . omeprazole (PRILOSEC) 20 MG capsule  . rosuvastatin (CRESTOR) 5 MG tablet   No current facility-administered medications for this encounter.     Wynonia Musty [facility] Short Stay Center/Anesthesiology Phone [phone] 01/21/2018 4:53 PM

## 2018-01-26 MED ORDER — TRANEXAMIC ACID-NACL 1000-0.7 MG/100ML-% IV SOLN
1000.0000 mg | INTRAVENOUS | Status: AC
  Administered 2018-01-27: 1000 mg via INTRAVENOUS
  Filled 2018-01-26: qty 100

## 2018-01-27 ENCOUNTER — Ambulatory Visit (HOSPITAL_COMMUNITY): Payer: Medicare Other | Admitting: Anesthesiology

## 2018-01-27 ENCOUNTER — Ambulatory Visit (HOSPITAL_COMMUNITY): Payer: Medicare Other

## 2018-01-27 ENCOUNTER — Encounter (HOSPITAL_COMMUNITY): Payer: Self-pay | Admitting: *Deleted

## 2018-01-27 ENCOUNTER — Other Ambulatory Visit: Payer: Self-pay

## 2018-01-27 ENCOUNTER — Encounter (HOSPITAL_COMMUNITY)
Admission: RE | Disposition: A | Discharge status: Home / Self Care | Payer: Self-pay | Source: Home / Self Care | Attending: Orthopedic Surgery

## 2018-01-27 ENCOUNTER — Ambulatory Visit (HOSPITAL_COMMUNITY): Payer: Medicare Other | Admitting: Physician Assistant

## 2018-01-27 ENCOUNTER — Observation Stay (HOSPITAL_COMMUNITY)
Admission: RE | Admit: 2018-01-27 | Discharge: 2018-01-28 | Disposition: A | Discharge status: Home / Self Care | Payer: Medicare Other | Attending: Orthopedic Surgery | Admitting: Orthopedic Surgery

## 2018-01-27 DIAGNOSIS — M48062 Spinal stenosis, lumbar region with neurogenic claudication: Principal | ICD-10-CM | POA: Insufficient documentation

## 2018-01-27 DIAGNOSIS — M5116 Intervertebral disc disorders with radiculopathy, lumbar region: Secondary | ICD-10-CM | POA: Diagnosis not present

## 2018-01-27 DIAGNOSIS — K219 Gastro-esophageal reflux disease without esophagitis: Secondary | ICD-10-CM | POA: Diagnosis not present

## 2018-01-27 DIAGNOSIS — Z981 Arthrodesis status: Secondary | ICD-10-CM | POA: Insufficient documentation

## 2018-01-27 DIAGNOSIS — M961 Postlaminectomy syndrome, not elsewhere classified: Secondary | ICD-10-CM | POA: Diagnosis not present

## 2018-01-27 DIAGNOSIS — Z79899 Other long term (current) drug therapy: Secondary | ICD-10-CM | POA: Insufficient documentation

## 2018-01-27 DIAGNOSIS — M48061 Spinal stenosis, lumbar region without neurogenic claudication: Secondary | ICD-10-CM | POA: Diagnosis present

## 2018-01-27 DIAGNOSIS — E785 Hyperlipidemia, unspecified: Secondary | ICD-10-CM | POA: Insufficient documentation

## 2018-01-27 DIAGNOSIS — E559 Vitamin D deficiency, unspecified: Secondary | ICD-10-CM | POA: Insufficient documentation

## 2018-01-27 DIAGNOSIS — E039 Hypothyroidism, unspecified: Secondary | ICD-10-CM | POA: Insufficient documentation

## 2018-01-27 DIAGNOSIS — Z419 Encounter for procedure for purposes other than remedying health state, unspecified: Secondary | ICD-10-CM

## 2018-01-27 DIAGNOSIS — I2510 Atherosclerotic heart disease of native coronary artery without angina pectoris: Secondary | ICD-10-CM | POA: Insufficient documentation

## 2018-01-27 DIAGNOSIS — E539 Vitamin B deficiency, unspecified: Secondary | ICD-10-CM | POA: Diagnosis not present

## 2018-01-27 DIAGNOSIS — Z79890 Hormone replacement therapy (postmenopausal): Secondary | ICD-10-CM | POA: Insufficient documentation

## 2018-01-27 HISTORY — PX: LUMBAR LAMINECTOMY/DECOMPRESSION MICRODISCECTOMY: SHX5026

## 2018-01-27 SURGERY — LUMBAR LAMINECTOMY/DECOMPRESSION MICRODISCECTOMY 1 LEVEL
Anesthesia: General

## 2018-01-27 MED ORDER — FENTANYL CITRATE (PF) 250 MCG/5ML IJ SOLN
INTRAMUSCULAR | Status: AC
  Filled 2018-01-27: qty 5

## 2018-01-27 MED ORDER — MENTHOL 3 MG MT LOZG: 1.0000 | LOZENGE | OROMUCOSAL | Status: DC | PRN

## 2018-01-27 MED ORDER — FENTANYL CITRATE (PF) 100 MCG/2ML IJ SOLN
25.0000 ug | INTRAMUSCULAR | Status: DC | PRN
  Administered 2018-01-27 (×2): 50 ug via INTRAVENOUS

## 2018-01-27 MED ORDER — PROPOFOL 1000 MG/100ML IV EMUL
INTRAVENOUS | Status: AC
  Filled 2018-01-27: qty 100

## 2018-01-27 MED ORDER — ONDANSETRON HCL 4 MG/2ML IJ SOLN: 4.0000 mg | Freq: Once | INTRAMUSCULAR | Status: DC | PRN

## 2018-01-27 MED ORDER — PROPOFOL 10 MG/ML IV BOLUS
INTRAVENOUS | Status: AC
  Filled 2018-01-27: qty 40

## 2018-01-27 MED ORDER — ONDANSETRON HCL 4 MG/2ML IJ SOLN: 4.0000 mg | Freq: Four times a day (QID) | INTRAMUSCULAR | Status: DC | PRN

## 2018-01-27 MED ORDER — LACTATED RINGERS IV SOLN: INTRAVENOUS | Status: DC

## 2018-01-27 MED ORDER — ACETAMINOPHEN 650 MG RE SUPP: 650.0000 mg | RECTAL | Status: DC | PRN

## 2018-01-27 MED ORDER — HYDROCODONE-ACETAMINOPHEN 5-325 MG PO TABS: 1.0000 | ORAL_TABLET | ORAL | Status: DC | PRN

## 2018-01-27 MED ORDER — OXYCODONE HCL 5 MG PO TABS
5.0000 mg | ORAL_TABLET | Freq: Once | ORAL | Status: AC | PRN
  Administered 2018-01-27: 5 mg via ORAL

## 2018-01-27 MED ORDER — ONDANSETRON HCL 4 MG PO TABS: 4.0000 mg | ORAL_TABLET | Freq: Three times a day (TID) | ORAL | 0 refills | Status: DC | PRN

## 2018-01-27 MED ORDER — HYDROCODONE-ACETAMINOPHEN 10-325 MG PO TABS: 1.0000 | ORAL_TABLET | Freq: Four times a day (QID) | ORAL | 0 refills | Status: AC | PRN

## 2018-01-27 MED ORDER — METHOCARBAMOL 500 MG PO TABS: 500.0000 mg | ORAL_TABLET | Freq: Three times a day (TID) | ORAL | 0 refills | Status: AC | PRN

## 2018-01-27 MED ORDER — THROMBIN (RECOMBINANT) 20000 UNITS EX SOLR
CUTANEOUS | Status: AC
  Filled 2018-01-27: qty 20000

## 2018-01-27 MED ORDER — LIDOCAINE 2% (20 MG/ML) 5 ML SYRINGE
INTRAMUSCULAR | Status: AC
  Filled 2018-01-27: qty 10

## 2018-01-27 MED ORDER — CEFAZOLIN SODIUM-DEXTROSE 1-4 GM/50ML-% IV SOLN
1.0000 g | Freq: Three times a day (TID) | INTRAVENOUS | Status: AC
  Administered 2018-01-27 (×2): 1 g via INTRAVENOUS
  Filled 2018-01-27 (×2): qty 50

## 2018-01-27 MED ORDER — PROPOFOL 10 MG/ML IV BOLUS
INTRAVENOUS | Status: DC | PRN
  Administered 2018-01-27: 160 mg via INTRAVENOUS
  Administered 2018-01-27 (×3): 20 mg via INTRAVENOUS
  Administered 2018-01-27: 40 mg via INTRAVENOUS
  Administered 2018-01-27 (×2): 20 mg via INTRAVENOUS

## 2018-01-27 MED ORDER — SODIUM CHLORIDE 0.9 % IV SOLN: 250.0000 mL | INTRAVENOUS | Status: DC

## 2018-01-27 MED ORDER — FENTANYL CITRATE (PF) 100 MCG/2ML IJ SOLN
INTRAMUSCULAR | Status: DC | PRN
  Administered 2018-01-27: 50 ug via INTRAVENOUS
  Administered 2018-01-27 (×2): 25 ug via INTRAVENOUS

## 2018-01-27 MED ORDER — OXYCODONE HCL 5 MG/5ML PO SOLN: 5.0000 mg | Freq: Once | ORAL | Status: AC | PRN

## 2018-01-27 MED ORDER — BUPIVACAINE-EPINEPHRINE (PF) 0.25% -1:200000 IJ SOLN
INTRAMUSCULAR | Status: AC
  Filled 2018-01-27: qty 30

## 2018-01-27 MED ORDER — ACETAMINOPHEN 500 MG PO TABS
500.0000 mg | ORAL_TABLET | Freq: Four times a day (QID) | ORAL | Status: DC | PRN
  Administered 2018-01-27 – 2018-01-28 (×2): 1000 mg via ORAL
  Filled 2018-01-27 (×2): qty 2

## 2018-01-27 MED ORDER — ROCURONIUM BROMIDE 50 MG/5ML IV SOSY
PREFILLED_SYRINGE | INTRAVENOUS | Status: AC
  Filled 2018-01-27: qty 10

## 2018-01-27 MED ORDER — DEXAMETHASONE SODIUM PHOSPHATE 10 MG/ML IJ SOLN
INTRAMUSCULAR | Status: AC
  Filled 2018-01-27: qty 1

## 2018-01-27 MED ORDER — METHYLPREDNISOLONE ACETATE 40 MG/ML IJ SUSP
INTRAMUSCULAR | Status: DC | PRN
  Administered 2018-01-27: 40 mg

## 2018-01-27 MED ORDER — MAGNESIUM CITRATE PO SOLN
1.0000 | Freq: Once | ORAL | Status: AC
  Administered 2018-01-27: 1 via ORAL
  Filled 2018-01-27: qty 296

## 2018-01-27 MED ORDER — LEVOTHYROXINE SODIUM 25 MCG PO TABS
25.0000 ug | ORAL_TABLET | Freq: Every day | ORAL | Status: DC
  Administered 2018-01-28: 25 ug via ORAL
  Filled 2018-01-27: qty 1

## 2018-01-27 MED ORDER — DEXAMETHASONE SODIUM PHOSPHATE 10 MG/ML IJ SOLN
INTRAMUSCULAR | Status: DC | PRN
  Administered 2018-01-27: 5 mg via INTRAVENOUS

## 2018-01-27 MED ORDER — HYDROCODONE-ACETAMINOPHEN 10-325 MG PO TABS: 2.0000 | ORAL_TABLET | ORAL | Status: DC | PRN

## 2018-01-27 MED ORDER — SODIUM CHLORIDE 0.9% FLUSH: 3.0000 mL | Freq: Two times a day (BID) | INTRAVENOUS | Status: DC

## 2018-01-27 MED ORDER — ALUM & MAG HYDROXIDE-SIMETH 200-200-20 MG/5ML PO SUSP: 30.0000 mL | Freq: Four times a day (QID) | ORAL | Status: DC | PRN

## 2018-01-27 MED ORDER — ONDANSETRON HCL 4 MG PO TABS: 4.0000 mg | ORAL_TABLET | Freq: Four times a day (QID) | ORAL | Status: DC | PRN

## 2018-01-27 MED ORDER — LEVOTHYROXINE SODIUM 25 MCG PO TABS
25.0000 ug | ORAL_TABLET | ORAL | Status: AC
  Administered 2018-01-27: 25 ug via ORAL
  Filled 2018-01-27 (×2): qty 1

## 2018-01-27 MED ORDER — ROCURONIUM BROMIDE 50 MG/5ML IV SOSY
PREFILLED_SYRINGE | INTRAVENOUS | Status: DC | PRN
  Administered 2018-01-27: 10 mg via INTRAVENOUS
  Administered 2018-01-27: 50 mg via INTRAVENOUS

## 2018-01-27 MED ORDER — FLEET ENEMA 7-19 GM/118ML RE ENEM: 1.0000 | ENEMA | Freq: Once | RECTAL | Status: DC | PRN

## 2018-01-27 MED ORDER — ONDANSETRON HCL 4 MG/2ML IJ SOLN
INTRAMUSCULAR | Status: DC | PRN
  Administered 2018-01-27: 4 mg via INTRAVENOUS

## 2018-01-27 MED ORDER — MIDAZOLAM HCL 2 MG/2ML IJ SOLN
INTRAMUSCULAR | Status: AC
  Filled 2018-01-27: qty 2

## 2018-01-27 MED ORDER — METHOCARBAMOL 1000 MG/10ML IJ SOLN
500.0000 mg | Freq: Four times a day (QID) | INTRAVENOUS | Status: DC | PRN
  Filled 2018-01-27: qty 5

## 2018-01-27 MED ORDER — METHOCARBAMOL 500 MG PO TABS
500.0000 mg | ORAL_TABLET | Freq: Four times a day (QID) | ORAL | Status: DC | PRN
  Administered 2018-01-27 (×2): 500 mg via ORAL
  Filled 2018-01-27: qty 1

## 2018-01-27 MED ORDER — LIDOCAINE 2% (20 MG/ML) 5 ML SYRINGE
INTRAMUSCULAR | Status: DC | PRN
  Administered 2018-01-27: 100 mg via INTRAVENOUS

## 2018-01-27 MED ORDER — ACETAMINOPHEN 10 MG/ML IV SOLN
INTRAVENOUS | Status: AC
  Filled 2018-01-27: qty 100

## 2018-01-27 MED ORDER — ACETAMINOPHEN 325 MG PO TABS: 650.0000 mg | ORAL_TABLET | ORAL | Status: DC | PRN

## 2018-01-27 MED ORDER — HEMOSTATIC AGENTS (NO CHARGE) OPTIME
TOPICAL | Status: DC | PRN
  Administered 2018-01-27: 1 via TOPICAL

## 2018-01-27 MED ORDER — SUGAMMADEX SODIUM 200 MG/2ML IV SOLN
INTRAVENOUS | Status: DC | PRN
  Administered 2018-01-27: 150 mg via INTRAVENOUS

## 2018-01-27 MED ORDER — METHYLPREDNISOLONE ACETATE 40 MG/ML IJ SUSP
INTRAMUSCULAR | Status: AC
  Filled 2018-01-27: qty 1

## 2018-01-27 MED ORDER — ACETAMINOPHEN 10 MG/ML IV SOLN
INTRAVENOUS | Status: DC | PRN
  Administered 2018-01-27: 1000 mg via INTRAVENOUS

## 2018-01-27 MED ORDER — 0.9 % SODIUM CHLORIDE (POUR BTL) OPTIME
TOPICAL | Status: DC | PRN
  Administered 2018-01-27: 1000 mL

## 2018-01-27 MED ORDER — PROPOFOL 500 MG/50ML IV EMUL
INTRAVENOUS | Status: DC | PRN
  Administered 2018-01-27: 50 ug/kg/min via INTRAVENOUS

## 2018-01-27 MED ORDER — OXYCODONE HCL 5 MG PO TABS
ORAL_TABLET | ORAL | Status: AC
  Filled 2018-01-27: qty 1

## 2018-01-27 MED ORDER — SODIUM CHLORIDE 0.9% FLUSH: 3.0000 mL | INTRAVENOUS | Status: DC | PRN

## 2018-01-27 MED ORDER — FENTANYL CITRATE (PF) 100 MCG/2ML IJ SOLN
INTRAMUSCULAR | Status: AC
  Filled 2018-01-27: qty 2

## 2018-01-27 MED ORDER — CEFAZOLIN SODIUM-DEXTROSE 2-4 GM/100ML-% IV SOLN
2.0000 g | INTRAVENOUS | Status: AC
  Administered 2018-01-27: 2 g via INTRAVENOUS
  Filled 2018-01-27: qty 100

## 2018-01-27 MED ORDER — BUPIVACAINE-EPINEPHRINE 0.25% -1:200000 IJ SOLN
INTRAMUSCULAR | Status: DC | PRN
  Administered 2018-01-27: 10 mL

## 2018-01-27 MED ORDER — PHENOL 1.4 % MT LIQD: 1.0000 | OROMUCOSAL | Status: DC | PRN

## 2018-01-27 MED ORDER — METHOCARBAMOL 500 MG PO TABS
ORAL_TABLET | ORAL | Status: AC
  Filled 2018-01-27: qty 1

## 2018-01-27 MED ORDER — ONDANSETRON HCL 4 MG/2ML IJ SOLN
INTRAMUSCULAR | Status: AC
  Filled 2018-01-27: qty 2

## 2018-01-27 MED ORDER — LACTATED RINGERS IV SOLN
INTRAVENOUS | Status: DC | PRN
  Administered 2018-01-27: 08:00:00 via INTRAVENOUS

## 2018-01-27 MED ORDER — POLYETHYLENE GLYCOL 3350 17 G PO PACK: 17.0000 g | PACK | Freq: Every day | ORAL | Status: DC | PRN

## 2018-01-27 MED ORDER — DOCUSATE SODIUM 100 MG PO CAPS
100.0000 mg | ORAL_CAPSULE | Freq: Two times a day (BID) | ORAL | Status: DC
  Administered 2018-01-27 (×2): 100 mg via ORAL
  Filled 2018-01-27 (×2): qty 1

## 2018-01-27 SURGICAL SUPPLY — 63 items
BNDG GAUZE ELAST 4 BULKY (GAUZE/BANDAGES/DRESSINGS) ×3 IMPLANT
BUR EGG ELITE 4.0 (BURR) IMPLANT
BUR EGG ELITE 4.0MM (BURR)
BUR MATCHSTICK NEURO 3.0 LAGG (BURR) IMPLANT
CANISTER SUCT 3000ML PPV (MISCELLANEOUS) ×3 IMPLANT
CLOSURE STERI-STRIP 1/2X4 (GAUZE/BANDAGES/DRESSINGS) ×1
CLSR STERI-STRIP ANTIMIC 1/2X4 (GAUZE/BANDAGES/DRESSINGS) ×2 IMPLANT
CORD BI POLAR (MISCELLANEOUS) ×3 IMPLANT
COVER SURGICAL LIGHT HANDLE (MISCELLANEOUS) ×3 IMPLANT
COVER WAND RF STERILE (DRAPES) ×3 IMPLANT
DRAIN CHANNEL 15F RND FF W/TCR (WOUND CARE) IMPLANT
DRAPE POUCH INSTRU U-SHP 10X18 (DRAPES) ×3 IMPLANT
DRAPE SURG 17X23 STRL (DRAPES) ×3 IMPLANT
DRAPE U-SHAPE 47X51 STRL (DRAPES) ×3 IMPLANT
DRSG OPSITE POSTOP 3X4 (GAUZE/BANDAGES/DRESSINGS) ×3 IMPLANT
DRSG OPSITE POSTOP 4X8 (GAUZE/BANDAGES/DRESSINGS) ×2 IMPLANT
DURAPREP 26ML APPLICATOR (WOUND CARE) ×3 IMPLANT
ELECT BLADE 4.0 EZ CLEAN MEGAD (MISCELLANEOUS)
ELECT CAUTERY BLADE 6.4 (BLADE) ×3 IMPLANT
ELECT PENCIL ROCKER SW 15FT (MISCELLANEOUS) ×3 IMPLANT
ELECT REM PT RETURN 9FT ADLT (ELECTROSURGICAL) ×3
ELECTRODE BLDE 4.0 EZ CLN MEGD (MISCELLANEOUS) IMPLANT
ELECTRODE REM PT RTRN 9FT ADLT (ELECTROSURGICAL) ×1 IMPLANT
EVACUATOR SILICONE 100CC (DRAIN) IMPLANT
FLOSEAL 10ML (HEMOSTASIS) IMPLANT
GLOVE BIO SURGEON STRL SZ 6.5 (GLOVE) ×2 IMPLANT
GLOVE BIO SURGEONS STRL SZ 6.5 (GLOVE) ×1
GLOVE BIOGEL PI IND STRL 6.5 (GLOVE) ×1 IMPLANT
GLOVE BIOGEL PI IND STRL 8.5 (GLOVE) ×1 IMPLANT
GLOVE BIOGEL PI INDICATOR 6.5 (GLOVE) ×2
GLOVE BIOGEL PI INDICATOR 8.5 (GLOVE) ×2
GLOVE SS BIOGEL STRL SZ 8.5 (GLOVE) ×1 IMPLANT
GLOVE SUPERSENSE BIOGEL SZ 8.5 (GLOVE) ×2
GOWN STRL REUS W/ TWL LRG LVL3 (GOWN DISPOSABLE) ×2 IMPLANT
GOWN STRL REUS W/TWL 2XL LVL3 (GOWN DISPOSABLE) ×3 IMPLANT
GOWN STRL REUS W/TWL LRG LVL3 (GOWN DISPOSABLE) ×4
KIT BASIN OR (CUSTOM PROCEDURE TRAY) ×3 IMPLANT
KIT TURNOVER KIT B (KITS) ×3 IMPLANT
MATRIX HEMOSTAT SURGIFLO (HEMOSTASIS) ×2 IMPLANT
NDL SPNL 18GX3.5 QUINCKE PK (NEEDLE) ×2 IMPLANT
NEEDLE 22X1 1/2 (OR ONLY) (NEEDLE) ×3 IMPLANT
NEEDLE SPNL 18GX3.5 QUINCKE PK (NEEDLE) ×6 IMPLANT
NS IRRIG 1000ML POUR BTL (IV SOLUTION) ×3 IMPLANT
PACK LAMINECTOMY ORTHO (CUSTOM PROCEDURE TRAY) ×3 IMPLANT
PACK UNIVERSAL I (CUSTOM PROCEDURE TRAY) ×3 IMPLANT
PAD ARMBOARD 7.5X6 YLW CONV (MISCELLANEOUS) ×6 IMPLANT
PATTIES SURGICAL .5 X.5 (GAUZE/BANDAGES/DRESSINGS) ×3 IMPLANT
PATTIES SURGICAL .5 X1 (DISPOSABLE) ×3 IMPLANT
SPONGE SURGIFOAM ABS GEL 100 (HEMOSTASIS) IMPLANT
SUT BONE WAX W31G (SUTURE) ×3 IMPLANT
SUT MON AB 3-0 SH 27 (SUTURE) ×2
SUT MON AB 3-0 SH27 (SUTURE) ×1 IMPLANT
SUT VIC AB 0 CT1 27 (SUTURE)
SUT VIC AB 0 CT1 27XBRD ANBCTR (SUTURE) IMPLANT
SUT VIC AB 1 CT1 18XCR BRD 8 (SUTURE) ×1 IMPLANT
SUT VIC AB 1 CT1 8-18 (SUTURE) ×2
SUT VIC AB 2-0 CT1 18 (SUTURE) ×3 IMPLANT
SYR BULB IRRIGATION 50ML (SYRINGE) ×3 IMPLANT
SYR CONTROL 10ML LL (SYRINGE) ×3 IMPLANT
TOWEL GREEN STERILE (TOWEL DISPOSABLE) ×3 IMPLANT
TOWEL GREEN STERILE FF (TOWEL DISPOSABLE) ×3 IMPLANT
WATER STERILE IRR 1000ML POUR (IV SOLUTION) ×3 IMPLANT
YANKAUER SUCT BULB TIP NO VENT (SUCTIONS) IMPLANT

## 2018-01-27 NOTE — Anesthesia Postprocedure Evaluation (Signed)
Anesthesia Post Note  Patient: Brittany Ashley  Procedure(s) Performed: Lumbar decompression L4-5 (N/A )     Patient location during evaluation: PACU Anesthesia Type: General Level of consciousness: awake and alert Pain management: pain level controlled Vital Signs Assessment: post-procedure vital signs reviewed and stable Respiratory status: spontaneous breathing, nonlabored ventilation and respiratory function stable Cardiovascular status: blood pressure returned to baseline and stable Postop Assessment: no apparent nausea or vomiting Anesthetic complications: no    Last Vitals:  Vitals:   01/27/18 1230 01/27/18 1251  BP:  137/68  Pulse: 66 69  Resp: 19 16  Temp:  36.6 C  SpO2: 97% 97%    Last Pain:  Vitals:   01/27/18 1310  TempSrc:   PainSc: Elizabeth

## 2018-01-27 NOTE — Discharge Instructions (Signed)
Laminectomy, Care After This sheet gives you information about how to care for yourself after your procedure. Your health care provider may also give you more specific instructions. If you have problems or questions, contact your health care provider. What can I expect after the procedure? After the procedure, it is common to have:  Some pain around your incision area.  Muscle tightening (spasms) across the back. Follow these instructions at home: Incision care   Follow instructions from your health care provider about how to take care of your incision area. Make sure you: ? Wash your hands with soap and water before and after you apply medicine to the area or change your bandage (dressing). If soap and water are not available, use hand sanitizer. ? Change your dressing as told by your health care provider. ? Leave stitches (sutures), skin glue, or adhesive strips in place. These skin closures may need to stay in place for 2 weeks or longer. If adhesive strip edges start to loosen and curl up, you may trim the loose edges. Do not remove adhesive strips completely unless your health care provider tells you to do that.  Check your incision area every day for signs of infection. Check for: ? More redness, swelling, or pain. ? More fluid or blood. ? Warmth. ? Pus or a bad smell. Medicines  Take over-the-counter and prescription medicines only as told by your health care provider.  If you were prescribed an antibiotic medicine, use it as told by your health care provider. Do not stop using the antibiotic even if you start to feel better. Bathing  Do not take baths, swim, or use a hot tub for 2 weeks, or until your incision has healed completely.  If your health care provider approves, you may take showers after your dressing has been removed. Activity   Return to your normal activities as told by your health care provider. Ask your health care provider what activities are safe for  you.  Avoid bending or twisting at your waist. Always bend at your knees.  Do not sit for more than 20-30 minutes at a time. Lie down or walk between periods of sitting.  Do not lift anything that is heavier than 10 lb (4.5 kg) or the limit that your health care provider tells you, until he or she says that it is safe.  Do not drive for 2 weeks after your procedure or for as long as your health care provider tells you.  Do not drive or use heavy machinery while taking prescription pain medicine. General instructions  To prevent or treat constipation while you are taking prescription pain medicine, your health care provider may recommend that you: ? Drink enough fluid to keep your urine clear or pale yellow. ? Take over-the-counter or prescription medicines. ? Eat foods that are high in fiber, such as fresh fruits and vegetables, whole grains, and beans. ? Limit foods that are high in fat and processed sugars, such as fried and sweet foods.  Do breathing exercises as told.  Keep all follow-up visits as told by your health care provider. This is important. Contact a health care provider if:  You have more redness, swelling, or pain around your incision area.  Your incision feels warm to the touch.  You are not able to return to activities or do exercises as told by your health care provider. Get help right away if:  You have: ? More fluid or blood coming from your incision area. ? Pus  or a bad smell coming from your incision area. ? Chills or a fever. ? Episodes of dizziness or fainting while standing.  You develop a rash.  You develop shortness of breath or you have difficulty breathing.  You cannot control when you urinate or have a bowel movement.  You become weak.  You are not able to use your legs. Summary  After the procedure, it is common to have some pain around your incision area. You may also have muscle tightening (spasms) across the back.  Follow  instructions from your health care provider about how to care for your incision.  Do not lift anything that is heavier than 10 lb (4.5 kg) or the limit that your health care provider tells you, until he or she says that it is safe.  Contact your health care provider if you have more redness, swelling, or pain around your incision area or if your incision feels warm to the touch. These can be signs of infection. This information is not intended to replace advice given to you by your health care provider. Make sure you discuss any questions you have with your health care provider. Document Released: 07/26/2004 Document Revised: 08/23/2015 Document Reviewed: 06/24/2015 Elsevier Interactive Patient Education  2019 [employer].

## 2018-01-27 NOTE — Brief Op Note (Signed)
01/27/2018  11:54 AM  PATIENT:  Brittany Ashley  77 y.o. female  PRE-OPERATIVE DIAGNOSIS:  Lumbar spinal stenosis L4-5  POST-OPERATIVE DIAGNOSIS:  Lumbar spinal stenosis L4-5  PROCEDURE:  Procedure(s) with comments: Lumbar decompression L4-5 (N/A) - 2.5 hrs  SURGEON:  Surgeon(s) and Role:    Melina Schools, MD - Primary  PHYSICIAN ASSISTANT:   ASSISTANTS: none   ANESTHESIA:   general  EBL:  75 mL   BLOOD ADMINISTERED:none  DRAINS: none   LOCAL MEDICATIONS USED:  MARCAINE    and OTHER depomedrol  SPECIMEN:  No Specimen  DISPOSITION OF SPECIMEN:  N/A  COUNTS:  YES  TOURNIQUET:  * No tourniquets in log *  DICTATION: .Dragon Dictation  PLAN OF CARE: Admit for overnight observation  PATIENT DISPOSITION:  PACU - hemodynamically stable.

## 2018-01-27 NOTE — Progress Notes (Signed)
Orthopedic Tech Progress Note Patient Details:  Brittany Ashley 08-14-1940 [id number] Patient has brace. Was already ordered Patient ID: [name], female   DOB: 04/16/1940, 78 y.o.   MRN: [id number]   [name] 01/27/2018, 2:15 PM

## 2018-01-27 NOTE — Op Note (Signed)
Operative report  Preoperative diagnosis: Lumbar spinal stenosis L4-5 with left L4-5 disc herniation  Postoperative diagnosis: Same  Operative procedure: L4-5 decompression with L4-5 left discectomy.  Bilateral medial facetectomy and foraminotomy.  Complications: None  Indications: This is a very pleasant 78 year old man who in 2017 had an L3-4 lateral interbody fusion and has done well until recently.  She started complaining of significant back buttock and bilateral leg pain left side worse than the right.  Imaging studies demonstrated severe central and lateral recess stenosis at L4-5 with an L4-5 posterior lateral disc herniation on the left side contributing to the stenosis.  After failed attempts at conservative management we elected to move forward with surgery.  All appropriate risks benefits and alternatives were discussed with the patient and consent was obtained.  Operative note patient was brought the operating room placed upon the operating room table.  After successful induction of general anesthesia and endotracheal patient teds SCD and a Foley were inserted.  She was turned prone onto the Wilson frame and the back was prepped and draped in a standard fashion.  All bony prominences well-padded and the arms were placed overhead and axillary pads were placed.  Timeout was taken to confirm patient procedure and all other important data.  2 needles were then placed into the back and an x-ray was taken for localization of the incision.  I marked out the midline incision and infiltrated with quarter percent Marcaine.  Midline incision was made and sharp dissection was carried out down to the deep fascia.  The fascia was sharply incised and I stripped the paraspinal muscles to expose the L4 and L5 spinous process and the L4-5 facet complex.  Self-retaining retractor blades were then placed and a Penfield 4 was placed underneath the L4 lamina.  Second intraoperative x-ray was taken confirming I  was at the appropriate level.  Once levels confirmed I then used a double-action Leksell rongeur to remove the bulk of the L4 spinous process.  A lamina spreader was then placed and I distracted the space which allowed me to perform a generous laminotomy with 3 mm Kerrison rongeurs.  At this point I then used my Penfield 4 to dissect through the central raphae of the ligamentum flavum and I used my 2 and 3 mm Kerrison punch to remove the central portion of the ligamentum flavum.  There was significant traction of the thecal sac noted.  This was consistent with what was seen on the preoperative MRI.  With gentle manipulation with a Penfield 4 I was able to develop a plane underneath the ligamentum flavum and bone spur and placed my neuro patty.  This allowed me to use my 2 mm Kerrison rongeur to continue my decompression into the lateral recess.  Using the same technique I went inferiorly and remove the superior portion of the L5 lamina to adequately complete the central decompression.  Once I had a central decompression complete I then proceeded into the lateral recess.  I went in on the right side as this was not as severe as the left.  I carried my dissection down to the lateral recess until I could visualize the L5 nerve root as well as the L5 pedicle.  A medial facetectomy was performed with a 2 mm Kerrison rongeur to complete the lateral recess decompression.  I was able to palpate up to the L4 foramen and visualize the L4 nerve root exiting in the traversing L5 nerve root and watch it exited into the foramen.  A foraminotomy was also performed with a 2 mm Kerrison rongeur at this point with the right lateral recess impression complete and the foraminotomy complete I then went to the contralateral side and began dissecting into the left side.  Again great care was taken to dissect and remove the thickened ligamentum flavum and osteophyte that had formed causing marked compression of the thecal sac and  traversing L5 nerve root.  I was able to palpate and visualize the medial border of the left L5 pedicle and ultimately visualize the left L5 nerve root.  The next 2 mm Kerrison rongeur performed a foraminotomy of L5 on the left side.  I then continued my dissection superiorly until I was able to visualize the L4 nerve root in its foramen.  Neuro patties were then placed inferiorly and superiorly to isolate the L4-5 disc space.  I gently used my Penfield 4 to mobilize the thecal sac so I can expose the posterior lateral aspect of the disc.  Once this was done an annulotomy was performed with a 15 blade scalpel and I used nerve hooks to mobilize the disc fragment.  I then remove these disc fragments with a micropituitary rongeur.  The osteophytes that had formed were then addressed with an Epstein curette.  Using the Epstein I was able to debulk those osteophytes.  At this point the L5 nerve root was clearly visualized and was no longer under compression or tension.  It was freely mobile.  Although there was still some osteophyte I elected not to continue to attempt to remove it.  My fear was that it was a going to be adherent to the ventral surface of the thecal sac and I could cause a dural tear.  With the central decompression complete there was no significant compression on the neural elements.  I was able to easily pass my [facility] in the lateral recess superiorly to the L4 pedicle and inferiorly into the L5 foramen.  This confirmed that I had adequate decompression.  Based on the preoperative MRI the maximum area of neurocompression was at the level of the disc space.  My decompression was above and below that area and so I had an adequate decompression of the central lateral recess and foraminal stenosis.  In addition the disc fragment was also removed.  With the decompression complete I irrigated the wound copiously normal saline and then used bipolar cautery and FloSeal to obtain and maintain  hemostasis.  After final irrigation I placed 1 cc of Depo-Medrol over the thecal sac and nerve root in order to maximize postoperative analgesia.  A large thrombin-soaked Gelfoam patty was placed over the laminotomy defect in order to aid in postoperative hemostasis.  The wound was then closed in a layered fashion with interrupted #1 Vicryl suture, 2-0 Vicryl suture, and 3-0 Monocryl.  Steri-Strips and a dry dressing were then applied.  The patient was ultimately extubated transfer the PACU without incident.  The end of the case all needle sponge counts were correct.  There were no adverse intraoperative events.

## 2018-01-27 NOTE — Evaluation (Signed)
Occupational Therapy Evaluation Patient Details Name: Brittany Ashley MRN: [id number] DOB: 1940/06/29 Today's Date: 01/27/2018    History of Present Illness Pt is a 78 y/o female s/p L 4-5 decompression with L4-5 discectomy, bilateral medial facetectomy and foraminotomy.   Has a past medical history of DDD (degenerative disc disease),OA (osteoarthritis), Osteopenia, Pneumonia, Scoliosis of lumbar spine, recent L3-4 AILF.   Clinical Impression   PTA patient independent and driving.  Admitted for above and limited by pain, precautions and activity tolerance.  Patient educated on precautions, ADL compensatory techniques, safety, DME/AE recommendations, brace mgmt and wear schedule and safety. Currently requires setup assist for UB ADLs, min assist LB ADLs, min guard for grooming standing at sink, close supervision for transfers, and hand held assist for short distance mobility in room.  Patient plans to dc home with family support, her sister will be staying with her initially but reports she needs to be independent due to health issues with her husband.  Patient will benefit from continued OT services while admitted in order to optimize independence and safety with ADLs/mobility, but anticipate no further needs after dc.  Next session to focus on LB AE and shower transfers.      Follow Up Recommendations  No OT follow up;Supervision - Intermittent    Equipment Recommendations  None recommended by OT    Recommendations for Other Services PT consult     Precautions / Restrictions Precautions Precautions: Back Precaution Booklet Issued: Yes (comment) Precaution Comments: reviewed precautions with patient Required Braces or Orthoses: Spinal Brace Spinal Brace: Lumbar corset Restrictions Weight Bearing Restrictions: No      Mobility Bed Mobility               General bed mobility comments: in restroom with RN upon therapist entering room  Transfers Overall transfer level: Needs  assistance Equipment used: None Transfers: Sit to/from Stand Sit to Stand: Supervision         General transfer comment: close supervision for safety with sit to stand, demonstrating good body mechanics and posture     Balance Overall balance assessment: Mild deficits observed, not formally tested                                         ADL either performed or assessed with clinical judgement   ADL Overall ADL's : Needs assistance/impaired     Grooming: Min guard;Standing;Cueing for compensatory techniques;Wash/dry hands       Lower Body Bathing: Minimal assistance;Sit to/from stand Lower Body Bathing Details (indicate cue type and reason): unable to complete figure 4 technique, plans to have her sister assist initially Upper Body Dressing : Set up;Sitting   Lower Body Dressing: Sit to/from stand;Minimal assistance;Cueing for compensatory techniques;Cueing for back precautions Lower Body Dressing Details (indicate cue type and reason): unable to complete figure 4 technique, will benefit from AE training  Toilet Transfer: Supervision/safety;Ambulation;BSC           Functional mobility during ADLs: Supervision/safety       Vision Patient Visual Report: No change from baseline Vision Assessment?: No apparent visual deficits     Perception     Praxis      Pertinent Vitals/Pain Pain Assessment: Faces Faces Pain Scale: Hurts a little bit Pain Location: back incision Pain Descriptors / Indicators: Discomfort;Operative site guarding Pain Intervention(s): Repositioned;Monitored during session     Hand Dominance  Extremity/Trunk Assessment Upper Extremity Assessment Upper Extremity Assessment: Overall WFL for tasks assessed   Lower Extremity Assessment Lower Extremity Assessment: Defer to PT evaluation   Cervical / Trunk Assessment Cervical / Trunk Assessment: Other exceptions Cervical / Trunk Exceptions: s/p lumbar sx   Communication  Communication Communication: No difficulties   Cognition Arousal/Alertness: Awake/alert Behavior During Therapy: WFL for tasks assessed/performed Overall Cognitive Status: Within Functional Limits for tasks assessed                                     General Comments  sister present and will be available initally    Exercises     Shoulder Instructions      Home Living Family/patient expects to be discharged to:: Private residence Living Arrangements: Spouse/significant other Available Help at Discharge: Family;Available 24 hours/day Type of Home: House Home Access: Stairs to enter [employer] of Steps: 2 Entrance Stairs-Rails: None Home Layout: Two level;Able to live on main level with bedroom/bathroom     Bathroom Shower/Tub: Occupational psychologist: Handicapped height     Home Equipment: [place] - single point;Bedside commode;Walker - 2 wheels   Additional Comments: reports shower is too small for shower chair      Prior Functioning/Environment Level of Independence: Independent        Comments: independent ADls, IADls limited by pain, and driving         OT Problem List: Decreased activity tolerance;Impaired balance (sitting and/or standing);Decreased knowledge of use of DME or AE;Decreased knowledge of precautions;Pain      OT Treatment/Interventions: Self-care/ADL training;Energy conservation;DME and/or AE instruction;Therapeutic activities;Patient/family education;Balance training    OT Goals(Current goals can be found in the care plan section) Acute Rehab OT Goals Patient Stated Goal: to get home  OT Goal Formulation: With patient Time For Goal Achievement: 02/10/18 Potential to Achieve Goals: Good  OT Frequency: Min 2X/week   Barriers to D/C:            Co-evaluation              AM-PAC OT "6 Clicks" Daily Activity     Outcome Measure Help from another person eating meals?: None Help from another person  taking care of personal grooming?: A Little Help from another person toileting, which includes using toliet, bedpan, or urinal?: A Little Help from another person bathing (including washing, rinsing, drying)?: A Little Help from another person to put on and taking off regular upper body clothing?: None Help from another person to put on and taking off regular lower body clothing?: A Little 6 Click Score: 20   End of Session Equipment Utilized During Treatment: Back brace Nurse Communication: Mobility status  Activity Tolerance: Patient tolerated treatment well Patient left: with call bell/phone within reach;Other (comment);with family/visitor present(seated EOB )  OT Visit Diagnosis: Other abnormalities of gait and mobility (R26.89);Pain Pain - part of body: (back)                Time: 2353-6144 OT Time Calculation (min): 19 min Charges:  OT General Charges $OT Visit: 1 Visit OT Evaluation $OT Eval Low Complexity: 1 Low  Delight Stare, OT Acute Rehabilitation Services Pager [phone] Office [phone]   Delight Stare 01/27/2018, 5:22 PM

## 2018-01-27 NOTE — H&P (Signed)
HPI The patient is here today for a pre-operative History and Physical. They are scheduled for Decompression L4-5 on 01-27-17 with Dr. Rolena Infante at [facility]. Patient continues to have back buttock and bilateral buttock pain consistent with neurogenic claudication. While she is ambulating she does hold herself in a forward flexed posture as extension or neutral positioning increases her back buttock and bilateral thigh pain. At this point she is eager to move forward with the lumbar decompression for her spinal stenosis at L4-5.  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  Clinical exam: Brittany Ashley is a pleasant individual, who appears younger than their stated age. She Is alert and orientated 3. No shortness of breath, chest pain. Abdomen is soft and non-tender, negative loss of bowel and bladder control, no rebound tenderness. Negative: skin lesions abrasions contusions  Lungs: Clear to auscultation bilaterally  Cardiac: Regular rate and rhythm no rubs or gallops murmurs  Peripheral pulses: 2+ dorsalis pedis/posterior tibialis pulses. Compartment soft and nontender.  Gait pattern: She ambulates in a forward flexed posture but she is stable  Assistive devices: No assistive devices  Neuro: 5 out of 5 strength in the lower extremity bilaterally. Positive dysesthesias  primarily in the hamstring and thigh when she extends the spine. Relief with forward flexion. Negative nerve root tension signs. 1+ deep tendon reflexes bilaterally at the knee and Achilles, negative Babinski test, no clonus  Musculoskeletal: Low back pain with neutral or extension of the spine. No significant hip, knee, ankle pain with isolated joint range of motion  MRI from 10/16/17: Mild lateral recess stenosis at L3-4 no significant central stenosis. Severe spinal stenosis at L4-5 centrally and in the lateral recess. Increase significant left neural foraminal narrowing.  Assessment / Plan Diagnosis: And returns today with ongoing significant complaints of back and left leg pain consistent with neurogenic claudication. Imaging studies demonstrate severe spinal stenosis but no instability at L4-5. She did have a recent injection that helped for a few weeks but overall her quality-of-life is diminishing.  I have gone over the procedure with the patient and her friend and all their questions were encouraged. We have also gone over the risks of surgery. Surgical plan: L4-5 lumbar decompression.  Risks and benefits of surgery were discussed with the patient. These include: Infection, bleeding, death, stroke, paralysis, ongoing or worse pain, need for additional surgery, leak of spinal fluid, adjacent segment degeneration requiring additional surgery, post-operative hematoma formation that can result in neurological compromise and the need for urgent/emergent re-operation. Loss in bowel and bladder control. Injury to major vessels that could result in the need for urgent abdominal surgery to stop bleeding. Risk of deep venous thrombosis (DVT) and the need for additional treatment. Recurrent disc herniation resulting in the need for revision surgery, which could include fusion surgery (utilizing instrumentation such as pedicle screws and intervertebral cages).

## 2018-01-27 NOTE — Transfer of Care (Signed)
Immediate Anesthesia Transfer of Care Note  Patient: Brittany Ashley  Procedure(s) Performed: Lumbar decompression L4-5 (N/A )  Patient Location: PACU  Anesthesia Type:General  Level of Consciousness: awake, alert , oriented and sedated  Airway & Oxygen Therapy: Patient Spontanous Breathing and Patient connected to nasal cannula oxygen  Post-op Assessment: Report given to RN, Post -op Vital signs reviewed and stable and Patient moving all extremities  Post vital signs: Reviewed and stable  Last Vitals:  Vitals Value Taken Time  BP 139/70 01/27/2018 11:56 AM  Temp    Pulse 72 01/27/2018 12:00 PM  Resp 20 01/27/2018 12:00 PM  SpO2 100 % 01/27/2018 12:00 PM  Vitals shown include unvalidated device data.  Last Pain:  Vitals:   01/27/18 0724  TempSrc:   PainSc: 0-No pain      Patients Stated Pain Goal: 2 (28/20/81 3887)  Complications: No apparent anesthesia complications

## 2018-01-27 NOTE — Anesthesia Procedure Notes (Signed)
Procedure Name: Intubation Date/Time: 01/27/2018 8:43 AM Performed by: Scheryl Darter, CRNA Pre-anesthesia Checklist: Patient identified, Emergency Drugs available, Suction available and Patient being monitored Patient Re-evaluated:Patient Re-evaluated prior to induction Oxygen Delivery Method: Circle System Utilized Preoxygenation: Pre-oxygenation with 100% oxygen Induction Type: IV induction Ventilation: Mask ventilation without difficulty Laryngoscope Size: Miller and 2 Grade View: Grade I Tube type: Oral Tube size: 7.0 mm Number of attempts: 1 Airway Equipment and Method: Stylet and Oral airway Placement Confirmation: ETT inserted through vocal cords under direct vision,  positive ETCO2 and breath sounds checked- equal and bilateral Secured at: 21 cm Tube secured with: Tape Dental Injury: Teeth and Oropharynx as per pre-operative assessment

## 2018-01-28 ENCOUNTER — Encounter (HOSPITAL_COMMUNITY): Payer: Self-pay | Admitting: Orthopedic Surgery

## 2018-01-28 DIAGNOSIS — M48062 Spinal stenosis, lumbar region with neurogenic claudication: Secondary | ICD-10-CM | POA: Diagnosis not present

## 2018-01-28 NOTE — Progress Notes (Signed)
Occupational Therapy Treatment and Discharge Patient Details Name: Brittany Ashley MRN: [id number] DOB: 02-11-1940 Today's Date: 01/28/2018    History of present illness Pt is a 78 y/o female s/p L 4-5 decompression with L4-5 discectomy, bilateral medial facetectomy and foraminotomy.   Has a past medical history of DDD (degenerative disc disease),OA (osteoarthritis), Osteopenia, Pneumonia, Scoliosis of lumbar spine, recent L3-4 AILF.   OT comments  Educated pt in crossing foot over opposite knee with pt demonstrating ability to perform LB dressing modified independently. Will use long handle bath sponge to reach feet standing to shower. Reviewed back precautions related to IADL, will have sister available to assist. Pt verbalized understanding of all information. No further OT needs.   Follow Up Recommendations  No OT follow up;Supervision - Intermittent    Equipment Recommendations  None recommended by OT    Recommendations for Other Services      Precautions / Restrictions Precautions Precautions: Back Precaution Comments: reviewed precautions with patient related to ADL and IADL Required Braces or Orthoses: Spinal Brace Spinal Brace: Lumbar corset;Applied in sitting position Restrictions Weight Bearing Restrictions: No       Mobility Bed Mobility Overal bed mobility: Modified Independent             General bed mobility comments: used log roll technique  Transfers Overall transfer level: Modified independent Equipment used: None             General transfer comment: somewhat slow to rise, but no physical assist from bed or toilet    Balance Overall balance assessment: Mild deficits observed, not formally tested                                         ADL either performed or assessed with clinical judgement   ADL Overall ADL's : Modified independent                                     Functional mobility during ADLs: Modified  independent General ADL Comments: pt demonstrated ability to don socks crossing foot over opposite knee, toilet without twisting for pericare and verbalized use of long handled bath sponge for back and feet in standing in shower.      Vision       Perception     Praxis      Cognition Arousal/Alertness: Awake/alert Behavior During Therapy: WFL for tasks assessed/performed Overall Cognitive Status: Within Functional Limits for tasks assessed                                          Exercises     Shoulder Instructions       General Comments      Pertinent Vitals/ Pain       Pain Assessment: Faces Faces Pain Scale: Hurts a little bit Pain Location: back incision Pain Descriptors / Indicators: Discomfort;Operative site guarding Pain Intervention(s): Monitored during session  Home Living                                          Prior Functioning/Environment  Frequency           Progress Toward Goals  OT Goals(current goals can now be found in the care plan section)  Progress towards OT goals: Goals met/education completed, patient discharged from OT  Acute Rehab OT Goals Patient Stated Goal: to get home   Plan All goals met and education completed, patient discharged from OT services    Co-evaluation                 AM-PAC OT "6 Clicks" Daily Activity     Outcome Measure   Help from another person eating meals?: None Help from another person taking care of personal grooming?: None Help from another person toileting, which includes using toliet, bedpan, or urinal?: None Help from another person bathing (including washing, rinsing, drying)?: None Help from another person to put on and taking off regular upper body clothing?: None Help from another person to put on and taking off regular lower body clothing?: None 6 Click Score: 24    End of Session Equipment Utilized During Treatment: Back  brace;Gait belt  OT Visit Diagnosis: Other abnormalities of gait and mobility (R26.89);Pain   Activity Tolerance Patient tolerated treatment well   Patient Left in bed;with call bell/phone within reach;with family/visitor present   Nurse Communication          Time: 7903-8333 OT Time Calculation (min): 13 min  Charges: OT General Charges $OT Visit: 1 Visit OT Treatments $Self Care/Home Management : 8-22 mins  Nestor Lewandowsky, OTR/L Acute Rehabilitation Services Pager: [phone] Office: [phone]   Malka So 01/28/2018, 9:53 AM

## 2018-01-28 NOTE — Evaluation (Signed)
Physical Therapy Evaluation and Discharge Patient Details Name: Brittany Ashley MRN: [id number] DOB: 1941-01-01 Today's Date: 01/28/2018   History of Present Illness  Pt is a 78 y/o female s/p L 4-5 decompression with L4-5 discectomy, bilateral medial facetectomy and foraminotomy.   Has a past medical history of DDD (degenerative disc disease),OA (osteoarthritis), Osteopenia, Pneumonia, Scoliosis of lumbar spine, recent L3-4 AILF.  Clinical Impression  Patient evaluated by Physical Therapy with no further acute PT needs identified. All education has been completed and the patient has no further questions. At the time of PT eval pt was able to perform transfers and ambulation with modified independence and no AD. Pt motivated to ambulate as she walks for exercise at baseline. She was educated on safe activity progression, brace application/wearing schedule, precautions, car transfer, and stair negotiation. Session somewhat limited by frequent diarrhea (pt had 3 bouts during encounter). See below for any follow-up Physical Therapy or equipment needs. PT is signing off. Thank you for this referral.     Follow Up Recommendations No PT follow up;Supervision - Intermittent    Equipment Recommendations  None recommended by PT    Recommendations for Other Services       Precautions / Restrictions Precautions Precautions: Back Precaution Booklet Issued: Yes (comment) Precaution Comments: reviewed precautions with patient related to ADL and IADL Required Braces or Orthoses: Spinal Brace Spinal Brace: Lumbar corset;Applied in sitting position Restrictions Weight Bearing Restrictions: No      Mobility  Bed Mobility Overal bed mobility: Modified Independent             General bed mobility comments: used log roll technique. HOB flat and rails lowered to simulate home environment.   Transfers Overall transfer level: Modified independent Equipment used: None Transfers: Sit to/from Stand            General transfer comment: VC's for hand placement on seated surface for safety as pt reaching for therapist assist. Was able to power-up to full stand without assistance.  Ambulation/Gait Ambulation/Gait assistance: Modified independent (Device/Increase time) Gait Distance (Feet): 500 Feet Assistive device: None Gait Pattern/deviations: Step-through pattern;Decreased stride length Gait velocity: Decreased Gait velocity interpretation: >2.62 ft/sec, indicative of community ambulatory General Gait Details: VC's for improved posture. Initially slowed and guarded however improved by end of gait training. Pt motivated for distance and educated on appropriate activity progression at home.   Stairs         General stair comments: Pt was verbally instructed in stair negotiation. We were in the stairwell and pt had sudden urge to have diarrhea and we returned to the room.   Wheelchair Mobility    Modified Rankin (Stroke Patients Only)       Balance Overall balance assessment: Mild deficits observed, not formally tested                                           Pertinent Vitals/Pain Pain Assessment: 0-10 Pain Score: 4  Faces Pain Scale: Hurts a little bit Pain Location: back incision Pain Descriptors / Indicators: Discomfort;Operative site guarding Pain Intervention(s): Monitored during session    Home Living Family/patient expects to be discharged to:: Private residence Living Arrangements: Spouse/significant other Available Help at Discharge: Family;Available 24 hours/day Type of Home: House Home Access: Stairs to enter Entrance Stairs-Rails: None Entrance Stairs-Number of Steps: 2 Home Layout: Two level;Able to live on main level with bedroom/bathroom  Home Equipment: Kasandra Knudsen - single point;Bedside commode;Walker - 2 wheels Additional Comments: reports shower is too small for shower chair    Prior Function Level of Independence: Independent          Comments: independent ADls, IADls limited by pain, and driving      Hand Dominance        Extremity/Trunk Assessment   Upper Extremity Assessment Upper Extremity Assessment: Overall WFL for tasks assessed    Lower Extremity Assessment Lower Extremity Assessment: Generalized weakness;LLE deficits/detail LLE Deficits / Details: Decreased strength and muscular endurance consistent with pre-op diagnosis. Noted decreased DF as well.     Cervical / Trunk Assessment Cervical / Trunk Assessment: Other exceptions Cervical / Trunk Exceptions: s/p lumbar sx  Communication   Communication: No difficulties  Cognition Arousal/Alertness: Awake/alert Behavior During Therapy: WFL for tasks assessed/performed Overall Cognitive Status: Within Functional Limits for tasks assessed                                        General Comments      Exercises     Assessment/Plan    PT Assessment Patent does not need any further PT services  PT Problem List         PT Treatment Interventions      PT Goals (Current goals can be found in the Care Plan section)  Acute Rehab PT Goals Patient Stated Goal: to get home  PT Goal Formulation: All assessment and education complete, DC therapy    Frequency     Barriers to discharge        Co-evaluation               AM-PAC PT "6 Clicks" Mobility  Outcome Measure Help needed turning from your back to your side while in a flat bed without using bedrails?: None Help needed moving from lying on your back to sitting on the side of a flat bed without using bedrails?: None Help needed moving to and from a bed to a chair (including a wheelchair)?: None Help needed standing up from a chair using your arms (e.g., wheelchair or bedside chair)?: None Help needed to walk in hospital room?: None Help needed climbing 3-5 steps with a railing? : A Little 6 Click Score: 23    End of Session Equipment Utilized During Treatment:  Gait belt;Back brace Activity Tolerance: Patient tolerated treatment well Patient left: in bed;with call bell/phone within reach;with family/visitor present Nurse Communication: Mobility status;Patient requests pain meds PT Visit Diagnosis: Pain;Other symptoms and signs involving the nervous system (R29.898) Pain - part of body: (back)    Time: 3300-7622 PT Time Calculation (min) (ACUTE ONLY): 29 min   Charges:   PT Evaluation $PT Eval Moderate Complexity: 1 Mod PT Treatments $Gait Training: 8-22 mins        Rolinda Roan, PT, DPT Acute Rehabilitation Services Pager: [phone] Office: [phone]   Thelma Comp 01/28/2018, 11:00 AM

## 2018-01-28 NOTE — Progress Notes (Signed)
Patient is discharged from room 3C03 at this time. Alert and in stable condition. IV site d/c'd and instructions read to patient and family with understanding verbalized. Left unit via wheelchair with all belongings at side.

## 2018-01-28 NOTE — Progress Notes (Signed)
    Subjective: Procedure(s) (LRB): Lumbar decompression L4-5 (N/A) 1 Day Post-Op  Patient reports pain as 1 on 0-10 scale.  Reports decreased leg pain reports incisional back pain   Positive void Positive bowel movement Positive flatus Negative chest pain or shortness of breath  Objective: Vital signs in last 24 hours: Temp:  [97.5 F (36.4 C)-98.5 F (36.9 C)] 97.8 F (36.6 C) (01/09 0740) Pulse Rate:  [66-79] 71 (01/09 0740) Resp:  [15-22] 18 (01/09 0740) BP: (113-144)/(61-78) 135/77 (01/09 0740) SpO2:  [93 %-100 %] 100 % (01/09 0740)  Intake/Output from previous day: 01/08 0701 - 01/09 0700 In: 1670 [P.O.:270; I.V.:1100; IV Piggyback:300] Out: 375 [Urine:300; Blood:75]  Labs: No results for input(s): WBC, RBC, HCT, PLT in the last 72 hours. No results for input(s): NA, K, CL, CO2, BUN, CREATININE, GLUCOSE, CALCIUM in the last 72 hours. No results for input(s): LABPT, INR in the last 72 hours.  Physical Exam: Neurologically intact ABD soft Intact pulses distally Dorsiflexion/Plantar flexion intact Incision: dressing C/D/I Compartment soft Body mass index is 23.12 kg/m.   Assessment/Plan: Patient stable  xrays n/a Continue mobilization with physical therapy Continue care  Advance diet Up with therapy  Patient is doing well overall.  Radicular leg pain has resolved.  Plan on discharge to home with follow-up in 2 weeks.  Melina Schools, MD [facility] [phone]

## 2018-01-28 NOTE — Discharge Summary (Signed)
Patient ID: Brittany Ashley MRN: [id number] DOB/AGE: 10-19-1940 78 y.o.  Admit date: 01/27/2018 Discharge date: 01/28/2018  Admission Diagnoses:  Active Problems:   Spinal stenosis at L4-L5 level   Discharge Diagnoses:  Active Problems:   Spinal stenosis at L4-L5 level  status post Procedure(s): Lumbar decompression L4-5  Past Medical History:  Diagnosis Date  . Benign neoplasm of colon   . Coronary atherosclerosis    coronary atherosclerosis (documented in history at [facility]); mild coronary calcification 03/29/13 chest CT--referred to cardiology by Dr. Bea Graff and had stress test with reportedly PRN f/u  . DDD (degenerative disc disease), lumbar   . Dyspnea    sometimes upon exertion  . GERD (gastroesophageal reflux disease)   . Hyperlipidemia   . Hyperopia with astigmatism and presbyopia, bilateral   . Hypothyroidism   . Nephrolithiasis   . Nontoxic thyroid nodule   . OA (osteoarthritis)   . Osteopenia   . Pneumonia   . Rosacea   . Scoliosis of lumbar spine    lumbar degenerative scoliosis with spinal stenosis  . Vitamin B deficiency   . Vitamin D deficiency   . Wears contact lenses    and glasses    Surgeries: Procedure(s): Lumbar decompression L4-5 on 01/27/2018   Consultants:   Discharged Condition: Improved  Hospital Course: Brittany Ashley is an 78 y.o. female who was admitted 01/27/2018 for operative treatment of lumbar spinal stenosis with neurogenic claudication left side worse than the right.. Patient failed conservative treatments (please see the history and physical for the specifics) and had severe unremitting pain that affects sleep, daily activities and work/hobbies. After pre-op clearance, the patient was taken to the operating room on 01/27/2018 and underwent  Procedure(s): Lumbar decompression L4-5.    Patient was given perioperative antibiotics:  Anti-infectives (From admission, onward)   Start     Dose/Rate Route Frequency Ordered Stop   01/27/18 1300  ceFAZolin (ANCEF) IVPB 1 g/50 mL premix     1 g 100 mL/hr over 30 Minutes Intravenous Every 8 hours 01/27/18 1246 01/28/18 0745   01/27/18 0715  ceFAZolin (ANCEF) IVPB 2g/100 mL premix     2 g 200 mL/hr over 30 Minutes Intravenous 30 min pre-op 01/27/18 0715 01/27/18 0845       Patient was given sequential compression devices and early ambulation to prevent DVT.   Patient benefited maximally from hospital stay and there were no complications. At the time of discharge, the patient was urinating/moving their bowels without difficulty, tolerating a regular diet, pain is controlled with oral pain medications and they have been cleared by PT/OT.   Hospital course was uneventful.  She indicates that the preoperative radicular leg pain had resolved.  Pain was well controlled with oral medications.  Recent vital signs:  Patient Vitals for the past 24 hrs:  BP Temp Temp src Pulse Resp SpO2  01/28/18 0740 135/77 97.8 F (36.6 C) Oral 71 18 100 %  01/28/18 0315 135/78 97.6 F (36.4 C) Oral 79 18 99 %  01/27/18 2322 123/69 98.5 F (36.9 C) Oral 75 18 96 %  01/27/18 1916 (!) 144/76 (!) 97.5 F (36.4 C) Oral 71 18 100 %  01/27/18 1551 126/72 (!) 97.5 F (36.4 C) Oral 75 16 97 %  01/27/18 1251 137/68 97.8 F (36.6 C) Oral 69 16 97 %  01/27/18 1230 - - - 66 19 97 %  01/27/18 1225 113/61 - - 73 15 94 %  01/27/18 1223 - - - -  16 93 %  01/27/18 1215 - - - 67 (!) 22 95 %  01/27/18 1210 127/61 - - 71 20 97 %  01/27/18 1200 139/70 97.9 F (36.6 C) - 73 15 100 %     Recent laboratory studies: No results for input(s): WBC, HGB, HCT, PLT, NA, K, CL, CO2, BUN, CREATININE, GLUCOSE, INR, CALCIUM in the last 72 hours.  Invalid input(s): PT, 2   Discharge Medications:   See hospital list  Diagnostic Studies: Dg Lumbar Spine 2-3 Views  Result Date: 01/27/2018 CLINICAL DATA:  L4-5 decompression EXAM: LUMBAR SPINE - 2-3 VIEW COMPARISON:  None. FINDINGS: First lateral intraoperative  image demonstrates posterior needles directed at the L4 and L5 vertebral bodies. Second lateral intraoperative image demonstrates posterior surgical instruments at the L4-5 level. IMPRESSION: Intraoperative localization as above. Electronically Signed   By: Rolm Baptise M.D.   On: 01/27/2018 11:38   Dg Spine Portable 1 View  Result Date: 01/27/2018 CLINICAL DATA:  Intraoperative imaging for patient undergoing lumbar surgery. EXAM: PORTABLE SPINE - 1 VIEW COMPARISON:  Single lateral view of the lumbar spine earlier today. FINDINGS: Single lateral view of the lumbar spine demonstrates a clamp on the L5 spinous process. A probe is directed toward the L4-5 level. IMPRESSION: Localization as above. Electronically Signed   By: Inge Rise M.D.   On: 01/27/2018 10:02    Discharge Instructions    Incentive spirometry RT   Complete by:  As directed       Follow-up [place], Raymel Cull, MD In 2 weeks.   Specialty:  Orthopedic Surgery Why:  If symptoms worsen, For suture removal, For wound re-check Contact information: [address] [place] [address] [phone]           Discharge Plan:  discharge to home  Disposition: Stable    Signed: Dahlia Bailiff for Dr. Melina Schools [facility] [phone] 01/28/2018, 10:48 AM

## 2018-06-21 ENCOUNTER — Ambulatory Visit: Payer: Self-pay | Admitting: Orthopedic Surgery

## 2018-06-25 ENCOUNTER — Ambulatory Visit: Payer: Self-pay | Admitting: Orthopedic Surgery

## 2018-06-25 NOTE — H&P (Signed)
Subjective:    The patient with PMH of L4/5 discectomy is here today for a pre-operative History and Physical. They are scheduled for revision of left L4-5 discectomy, PSFI L4-5 on 07-01-18 with Dr. Rolena Infante at [facility].  Patient had L4/5 discectomy in January 2019 (currently 5.5 mos post op) , at her 2 week post-operative visit she was doing well and radicular leg pain had resolved. Unfortunately, she began experiencing increasing radicular leg pain. A medrol dosepack and gabapentin were not successful in decreasing pain and improving quality of life. A MRI with contrast of lumbar spine confirmed a repeat herniation with increased loss of disc space height. Today the pain is 10/10 and effecting the low back and left leg. Pain had failed to improve with OTC medications and patient's quality of life has been significantly decreased. Therefore, she would like to move forward with surgical intervention.  Surgical clearance from PCP obtained. Patient has LSO brace from previous surgery. Patient is scheduled for pre-op eval and testing at [facility] on 06/29/2018.  Patient Active Problem List   Diagnosis Date Noted  . Spinal stenosis at L4-L5 level 01/27/2018  . Status post lumbar spine operation 09/24/2016  . Status post lumbar spinal fusion 09/24/2016   Past Medical History:  Diagnosis Date  . Benign neoplasm of colon   . Coronary atherosclerosis    coronary atherosclerosis (documented in history at [facility]); mild coronary calcification 03/29/13 chest CT--referred to cardiology by Dr. Bea Graff and had stress test with reportedly PRN f/u  . DDD (degenerative disc disease), lumbar   . Dyspnea    sometimes upon exertion  . GERD (gastroesophageal reflux disease)   . Hyperlipidemia   . Hyperopia with astigmatism and presbyopia, bilateral   . Hypothyroidism   . Nephrolithiasis   . Nontoxic thyroid nodule   . OA (osteoarthritis)   . Osteopenia   . Pneumonia   . Rosacea    . Scoliosis of lumbar spine    lumbar degenerative scoliosis with spinal stenosis  . Vitamin B deficiency   . Vitamin D deficiency   . Wears contact lenses    and glasses    Past Surgical History:  Procedure Laterality Date  . ANTERIOR LAT LUMBAR FUSION N/A 09/24/2016   Procedure: XLIF L3-4;  Surgeon: Melina Schools, MD;  Location: [place];  Service: Orthopedics;  Laterality: N/A;  3.5 hrs  . COLONOSCOPY     biopsy with polypectomy  . FRACTURE SURGERY     right ankle  . LUMBAR LAMINECTOMY/DECOMPRESSION MICRODISCECTOMY N/A 01/27/2018   Procedure: Lumbar decompression L4-5;  Surgeon: Melina Schools, MD;  Location: [place];  Service: Orthopedics;  Laterality: N/A;  2.5 hrs    Current Outpatient Medications  Medication Sig Dispense Refill Last Dose  . Cyanocobalamin (B-12 COMPLIANCE INJECTION) 1000 MCG/ML KIT Inject 1 mL as directed every Saturday.    Past Week at Unknown time  . levothyroxine (SYNTHROID, LEVOTHROID) 25 MCG tablet Take 25 mcg by mouth daily before breakfast.    01/26/2018 at Unknown time  . omeprazole (PRILOSEC) 20 MG capsule Take 20 mg by mouth daily.   01/26/2018 at Unknown time  . ondansetron (ZOFRAN) 4 MG tablet Take 1 tablet (4 mg total) by mouth every 8 (eight) hours as needed for nausea or vomiting. 20 tablet 0   . rosuvastatin (CRESTOR) 5 MG tablet Take 5 mg by mouth daily.   01/26/2018 at Unknown time   No current facility-administered medications for this visit.    No  Known Allergies  Social History   Tobacco Use  . Smoking status: Never Smoker  . Smokeless tobacco: Never Used  Substance Use Topics  . Alcohol use: No    Family History  Problem Relation Age of Onset  . Colon cancer Mother   . Congestive Heart Failure Father   . Breast cancer Sister   . Diabetes Brother     Review of Systems As stated in HPI.  Objective:   Clinical exam: Patient is alert and oriented 3. No shortness of breath, chest pain.  Cardiovascular: Regular rate and rhythm, no rubs,  murmurs, or gallops.  Lungs: Clear to auscultation bilaterally.  Abdomen: Bowel sounds 4. Soft and nontender, Nondistended, no hepatosplenomegaly. No loss of bowel and bladder control.  Incision: She denies any fevers chills. Surgical wound is well healed no signs of infection.  Lumbar spine: Ongoing moderate to severe low back pain. Pain predominate radiates into the left L4 dermatome. Progressive debilitating neuropathic left leg pain. No significant hip, knee, ankle pain with isolated joint range of motion. No significant SI joint pain.    Neuro: Positive femoral stretch test on the left side. 5/5 motor strength bilaterally in lower extremity. She is currently ambulate with a cane because of significant neuropathic left leg pain. Positive numbness and dysesthesias the left lower extremity.  Reflexes: Babinski: Negative Hoffman: Negative  Vascular: Lower extremity peripheral pulses are 2+ dorsalis pedis/posterior tibialis pulses bilaterally. Compartments are soft and nontender  Imaging studies: MRI from 04/06/18 was rereviewed. At this point I think her primary pathology is still new large left far lateral disc herniation at L4-5 with significant L4 neural compress; Updated x-rays of lumbar spine were obtained at today's office visit, no significant changes were noted compared to previous x-rays of lumbar spine.  Assessment:    Brittany Ashley is a very pleasant 78 year old woman who is now 5.5 months status post L4-5 central decompression with left posterior lateral discectomy. Initially after the surgery she did well and she had resolution of her neuropathic left leg pain. Unfortunately some point in mid if to late February 2020 she started having acute severe left leg pain. Imaging studies demonstrated a recurrent far lateral disc herniation at L4-5 on the left side. At this point despite steroid medications, activity modifications, and gabapentin her quality-of-life his continue to suffer. Her  primary problem is the neuropathic left leg pain. The patient does have progressive loss in sensation and positive nerve root tension signs. She is now ambulating with a cane and having greater and greater difficulty. At this point time she would like to move forward with a revision surgery which I think is reasonable.  Plan:    At this point I would have to do a far lateral discectomy and essentially remove the remaining portion of the facet complex at L4-5. Given the amount of bony decompression that would be required to safely remove the new disc fragment there is an increased risk of iatrogenic instability. Because she is already had a decompression at that level and now needs a revision decompression which would further compromise the bony stability of the L4-5 level I recommended a posterior instrumented fusion/arthrodesis.  I have gone over the risks and benefits of the surgery: Risks and benefits of surgery were discussed with the patient. These include: Infection, bleeding, death, stroke, paralysis, ongoing or worse pain, need for additional surgery, leak of spinal fluid, adjacent segment degeneration requiring additional surgery, post-operative hematoma formation that can result in neurological compromise and the  need for urgent/emergent re-operation. Loss in bowel and bladder control. Injury to major vessels that could result in the need for urgent abdominal surgery to stop bleeding. Risk of deep venous thrombosis (DVT) and the need for additional treatment. Recurrent disc herniation resulting in the need for revision surgery, which could include fusion surgery (utilizing instrumentation such as pedicle screws and intervertebral cages). Additional risk: If instrumentation is used there is a risk of migration, or breakage of that hardware that could require additional surgery.  We have also discussed the goals of surgery to include: Goals of surgery: Reduction in pain, and improvement in quality  of life.  We have also discussed the post-operative recovery period to include: bathing/showering restrictions, wound healing, activity (and driving) restrictions, medications/pain management.  We have also discussed post-operative redflags to include: signs and symptoms of postoperative infection, DVT/PE.  All patients questions were invited and answered.  We will plan on moving forward with a Revision L4/5 discectomy and posterior spinal fusion interbody On June 11 at [facility].  Follow-up: 2 weeks status post surgery and office (07/16/2018)

## 2018-06-28 NOTE — Pre-Procedure Instructions (Signed)
Brittany Ashley  06/28/2018      [facility] [place], [place] AT [address] [place] [address] Phone: [phone] Fax: [phone]    Your procedure is scheduled on Thurs., July 01, 2018 from 11:05AM-2:05PM  Report to [facility] Entrance "A" at 9:05AM  Call this number if you have problems the morning of surgery:  [phone]   Remember:  Do not eat or drink after midnight on June 10th    Take these medicines the morning of surgery with A SIP OF WATER: Gabapentin (NEURONTIN), Levothyroxine (SYNTHROID, LEVOTHROID), Omeprazole (PRILOSEC), and Rosuvastatin (CRESTOR)  If needed: Acetaminophen (TYLENOL)  As of today, stop taking all Other Aspirin Products, Vitamins, Fish oils, and Herbal medications. Also stop all NSAIDS i.e. Advil, Ibuprofen, Motrin, Aleve, Anaprox, Naproxen, BC, Goody Powders, and all Supplements.    Special instructions:  [place]- Preparing For Surgery  Before surgery, you can play an important role. Because skin is not sterile, your skin needs to be as free of germs as possible. You can reduce the number of germs on your skin by washing with CHG (chlorahexidine gluconate) Soap before surgery.  CHG is an antiseptic cleaner which kills germs and bonds with the skin to continue killing germs even after washing.    Please do not use if you have an allergy to CHG or antibacterial soaps. If your skin becomes reddened/irritated stop using the CHG.  Do not shave (including legs and underarms) for at least 48 hours prior to first CHG shower. It is OK to shave your face.  Please follow these instructions carefully.   1. Shower the NIGHT BEFORE SURGERY and the MORNING OF SURGERY with CHG.   2. If you chose to wash your hair, wash your hair first as usual with your normal shampoo.  3. After you shampoo, rinse your hair and body thoroughly to remove the shampoo.  4. Use CHG as you  would any other liquid soap. You can apply CHG directly to the skin and wash gently with a scrungie or a clean washcloth.   5. Apply the CHG Soap to your body ONLY FROM THE NECK DOWN.  Do not use on open wounds or open sores. Avoid contact with your eyes, ears, mouth and genitals (private parts). Wash Face and genitals (private parts)  with your normal soap.  6. Wash thoroughly, paying special attention to the area where your surgery will be performed.  7. Thoroughly rinse your body with warm water from the neck down.  8. DO NOT shower/wash with your normal soap after using and rinsing off the CHG Soap.  9. Pat yourself dry with a CLEAN TOWEL.  10. Wear CLEAN PAJAMAS to bed the night before surgery, wear comfortable clothes the morning of surgery  11. Place CLEAN SHEETS on your bed the night of your first shower and DO NOT SLEEP WITH PETS.  Day of Surgery: Oral Hygiene is also important to reduce your risk of infection.  Remember - BRUSH YOUR TEETH THE MORNING OF SURGERY WITH YOUR REGULAR TOOTHPASTE   Do not wear jewelry, make-up or nail polish.  Do not wear lotions, powders, or perfumes, or deodorant.  Do not shave 48 hours prior to surgery.    Do not bring valuables to the hospital.  [facility] is not responsible for any belongings or valuables.  Contacts, dentures or bridgework may not be  worn into surgery.   For patients admitted to the hospital, discharge time will be determined by your treatment team.  Patients discharged the day of surgery will not be allowed to drive home.     Please wear clean clothes to the hospital/surgery center.    Please read over the following fact sheets that you were given. Pain Booklet, Coughing and Deep Breathing, MRSA Information and Surgical Site Infection Prevention

## 2018-06-29 ENCOUNTER — Encounter (HOSPITAL_COMMUNITY)
Admission: RE | Admit: 2018-06-29 | Discharge: 2018-06-29 | Disposition: A | Discharge status: Home / Self Care | Payer: Medicare Other | Source: Ambulatory Visit | Attending: Orthopedic Surgery | Admitting: Orthopedic Surgery

## 2018-06-29 ENCOUNTER — Other Ambulatory Visit: Payer: Self-pay

## 2018-06-29 ENCOUNTER — Encounter (HOSPITAL_COMMUNITY): Payer: Self-pay

## 2018-06-29 ENCOUNTER — Other Ambulatory Visit (HOSPITAL_COMMUNITY)
Admission: RE | Admit: 2018-06-29 | Discharge: 2018-06-29 | Disposition: A | Discharge status: Home / Self Care | Payer: Medicare Other | Source: Ambulatory Visit | Attending: Orthopedic Surgery | Admitting: Orthopedic Surgery

## 2018-06-29 ENCOUNTER — Ambulatory Visit (HOSPITAL_COMMUNITY)
Admission: RE | Admit: 2018-06-29 | Discharge: 2018-06-29 | Disposition: A | Discharge status: Home / Self Care | Payer: Medicare Other | Source: Ambulatory Visit | Attending: Orthopedic Surgery | Admitting: Orthopedic Surgery

## 2018-06-29 DIAGNOSIS — Z01818 Encounter for other preprocedural examination: Secondary | ICD-10-CM

## 2018-06-29 LAB — BASIC METABOLIC PANEL
Anion gap: 8 (ref 5–15)
BUN: 9 mg/dL (ref 8–23)
CO2: 24 mmol/L (ref 22–32)
Calcium: 9.8 mg/dL (ref 8.9–10.3)
Chloride: 108 mmol/L (ref 98–111)
Creatinine, Ser: 0.63 mg/dL (ref 0.44–1.00)
GFR calc Af Amer: >60 mL/min (ref >60)
GFR calc non Af Amer: >60 mL/min (ref >60)
Glucose, Bld: 103 mg/dL — ABNORMAL HIGH (ref 70–99)
Potassium: 4 mmol/L (ref 3.5–5.1)
Sodium: 140 mmol/L (ref 135–145)

## 2018-06-29 LAB — URINALYSIS, ROUTINE W REFLEX MICROSCOPIC
Bacteria, UA: NONE SEEN
Bilirubin Urine: NEGATIVE
Glucose, UA: NEGATIVE mg/dL
Ketones, ur: NEGATIVE mg/dL
Nitrite: NEGATIVE
Protein, ur: NEGATIVE mg/dL
Specific Gravity, Urine: 1.009 (ref 1.005–1.030)
pH: 7 (ref 5.0–8.0)

## 2018-06-29 LAB — PROTIME-INR
INR: 1.1 (ref 0.8–1.2)
Prothrombin Time: 13.6 seconds (ref 11.4–15.2)

## 2018-06-29 LAB — SURGICAL PCR SCREEN
MRSA, PCR: NEGATIVE
Staphylococcus aureus: NEGATIVE

## 2018-06-29 LAB — CBC
HCT: 39.9 % (ref 36.0–46.0)
Hemoglobin: 12.8 g/dL (ref 12.0–15.0)
MCH: 30.2 pg (ref 26.0–34.0)
MCHC: 32.1 g/dL (ref 30.0–36.0)
MCV: 94.1 fL (ref 80.0–100.0)
Platelets: 196 10*3/uL (ref 150–400)
RBC: 4.24 MIL/uL (ref 3.87–5.11)
RDW: 12.1 % (ref 11.5–15.5)
WBC: 4.9 10*3/uL (ref 4.0–10.5)
nRBC: 0 % (ref 0.0–0.2)

## 2018-06-29 LAB — APTT: aPTT: 28 seconds (ref 24–36)

## 2018-06-29 NOTE — Progress Notes (Signed)
PCP - Dr. Gilford Rile Cardiologist - denies  Chest x-ray - 06/29/2018 EKG - 06/22/2018 CE - tracing requested Stress Test -  Patient unsure - requested if applicable ECHO - Patient unsure - requested if applicable Cardiac Cath - denies  Sleep Study - denies CPAP - N/A  Blood Thinner Instructions: N/A Aspirin Instructions: N/A  Anesthesia review: YES, surgery order, EKG tracing requested  Coronavirus Screening  Have you experienced the following symptoms:  Cough yes/no: No Fever (>100.66F)  yes/no: No Runny nose yes/no: No Sore throat yes/no: No Difficulty breathing/shortness of breath  yes/no: No  Have you or a family member traveled in the last 14 days and where? yes/no: No  If the patient indicates "YES" to the above questions, their PAT will be rescheduled to limit the exposure to others and, the surgeon will be notified. THE PATIENT WILL NEED TO BE ASYMPTOMATIC FOR 14 DAYS.   If the patient is not experiencing any of these symptoms, the PAT nurse will instruct them to NOT bring anyone with them to their appointment since they may have these symptoms or traveled as well.   Please remind your patients and families that hospital visitation restrictions are in effect and the importance of the restrictions.   Patient denies shortness of breath, fever, cough and chest pain at PAT appointment  Patient verbalized understanding of instructions that were given to them at the PAT appointment. Patient was also instructed that they will need to review over the PAT instructions again at home before surgery.

## 2018-06-29 NOTE — Anesthesia Preprocedure Evaluation (Addendum)
Anesthesia Evaluation  Patient identified by MRN, date of birth, ID band Patient awake    Reviewed: Allergy & Precautions, NPO status , Patient's Chart, lab work & pertinent test results  Airway Mallampati: II  TM Distance: >3 FB Neck ROM: Full    Dental no notable dental hx.    Pulmonary neg pulmonary ROS,    Pulmonary exam normal breath sounds clear to auscultation       Cardiovascular negative cardio ROS Normal cardiovascular exam Rhythm:Regular Rate:Normal     Neuro/Psych negative neurological ROS  negative psych ROS   GI/Hepatic Neg liver ROS, GERD  ,  Endo/Other  Hypothyroidism   Renal/GU negative Renal ROS  negative genitourinary   Musculoskeletal negative musculoskeletal ROS (+)   Abdominal   Peds negative pediatric ROS (+)  Hematology negative hematology ROS (+)   Anesthesia Other Findings   Reproductive/Obstetrics negative OB ROS                             Anesthesia Physical Anesthesia Plan  ASA: II  Anesthesia Plan: General   Post-op Pain Management:    Induction: Intravenous  PONV Risk Score and Plan: 3 and Ondansetron, Dexamethasone, Midazolam and Treatment may vary due to age or medical condition  Airway Management Planned: Oral ETT  Additional Equipment:   Intra-op Plan:   Post-operative Plan: Extubation in OR  Informed Consent: I have reviewed the patients History and Physical, chart, labs and discussed the procedure including the risks, benefits and alternatives for the proposed anesthesia with the patient or authorized representative who has indicated his/her understanding and acceptance.     Dental advisory given  Plan Discussed with: CRNA  Anesthesia Plan Comments: (See previous PAT note 01/19/2018 by Karoline Caldwell, PA-C prior to Lumbar decompression (cleared by cardiology at that time). No significant interim health changes. She was seen by PCP  Mayer Camel, NP 06/10/18 and cleared for surgery. )       Anesthesia Quick Evaluation

## 2018-06-30 LAB — NOVEL CORONAVIRUS, NAA (HOSP ORDER, SEND-OUT TO REF LAB; TAT 18-24 HRS): SARS-CoV-2, NAA: NOT DETECTED

## 2018-07-01 ENCOUNTER — Other Ambulatory Visit: Payer: Self-pay

## 2018-07-01 ENCOUNTER — Inpatient Hospital Stay (HOSPITAL_COMMUNITY): Payer: Medicare Other

## 2018-07-01 ENCOUNTER — Inpatient Hospital Stay (HOSPITAL_COMMUNITY)
Admission: RE | Disposition: A | Discharge status: Home / Self Care | Payer: Self-pay | Source: Home / Self Care | Attending: Orthopedic Surgery

## 2018-07-01 ENCOUNTER — Inpatient Hospital Stay (HOSPITAL_COMMUNITY): Payer: Medicare Other | Admitting: Certified Registered Nurse Anesthetist

## 2018-07-01 ENCOUNTER — Inpatient Hospital Stay (HOSPITAL_COMMUNITY)
Admission: RE | Admit: 2018-07-01 | Discharge: 2018-07-02 | DRG: 460 | Disposition: A | Discharge status: Home / Self Care | Payer: Medicare Other | Attending: Orthopedic Surgery | Admitting: Orthopedic Surgery

## 2018-07-01 ENCOUNTER — Inpatient Hospital Stay (HOSPITAL_COMMUNITY): Payer: Medicare Other | Admitting: Physician Assistant

## 2018-07-01 ENCOUNTER — Encounter (HOSPITAL_COMMUNITY): Payer: Self-pay

## 2018-07-01 DIAGNOSIS — M8580 Other specified disorders of bone density and structure, unspecified site: Secondary | ICD-10-CM | POA: Diagnosis present

## 2018-07-01 DIAGNOSIS — Z419 Encounter for procedure for purposes other than remedying health state, unspecified: Secondary | ICD-10-CM

## 2018-07-01 DIAGNOSIS — Z79890 Hormone replacement therapy (postmenopausal): Secondary | ICD-10-CM | POA: Diagnosis not present

## 2018-07-01 DIAGNOSIS — E039 Hypothyroidism, unspecified: Secondary | ICD-10-CM | POA: Diagnosis present

## 2018-07-01 DIAGNOSIS — Z981 Arthrodesis status: Secondary | ICD-10-CM

## 2018-07-01 DIAGNOSIS — Z833 Family history of diabetes mellitus: Secondary | ICD-10-CM | POA: Diagnosis not present

## 2018-07-01 DIAGNOSIS — K219 Gastro-esophageal reflux disease without esophagitis: Secondary | ICD-10-CM | POA: Diagnosis present

## 2018-07-01 DIAGNOSIS — Z79899 Other long term (current) drug therapy: Secondary | ICD-10-CM

## 2018-07-01 DIAGNOSIS — Z8719 Personal history of other diseases of the digestive system: Secondary | ICD-10-CM | POA: Diagnosis not present

## 2018-07-01 DIAGNOSIS — E785 Hyperlipidemia, unspecified: Secondary | ICD-10-CM | POA: Diagnosis present

## 2018-07-01 DIAGNOSIS — Z800 Family history of malignant neoplasm of digestive organs: Secondary | ICD-10-CM | POA: Diagnosis not present

## 2018-07-01 DIAGNOSIS — M5116 Intervertebral disc disorders with radiculopathy, lumbar region: Secondary | ICD-10-CM | POA: Diagnosis present

## 2018-07-01 DIAGNOSIS — Z1159 Encounter for screening for other viral diseases: Secondary | ICD-10-CM | POA: Diagnosis not present

## 2018-07-01 DIAGNOSIS — Z803 Family history of malignant neoplasm of breast: Secondary | ICD-10-CM

## 2018-07-01 DIAGNOSIS — Z8249 Family history of ischemic heart disease and other diseases of the circulatory system: Secondary | ICD-10-CM | POA: Diagnosis not present

## 2018-07-01 SURGERY — POSTERIOR LUMBAR FUSION 1 LEVEL
Anesthesia: General | Site: Spine Lumbar

## 2018-07-01 MED ORDER — CEFAZOLIN SODIUM-DEXTROSE 2-4 GM/100ML-% IV SOLN
INTRAVENOUS | Status: AC
  Filled 2018-07-01: qty 100

## 2018-07-01 MED ORDER — OXYCODONE HCL 5 MG PO TABS: 5.0000 mg | ORAL_TABLET | ORAL | Status: DC | PRN

## 2018-07-01 MED ORDER — FENTANYL CITRATE (PF) 250 MCG/5ML IJ SOLN
INTRAMUSCULAR | Status: AC
  Filled 2018-07-01: qty 5

## 2018-07-01 MED ORDER — CEFAZOLIN SODIUM-DEXTROSE 2-4 GM/100ML-% IV SOLN
2.0000 g | INTRAVENOUS | Status: AC
  Administered 2018-07-01: 2 g via INTRAVENOUS

## 2018-07-01 MED ORDER — OXYCODONE HCL 5 MG/5ML PO SOLN: 5.0000 mg | Freq: Once | ORAL | Status: AC | PRN

## 2018-07-01 MED ORDER — SODIUM CHLORIDE 0.9% FLUSH: 3.0000 mL | Freq: Two times a day (BID) | INTRAVENOUS | Status: DC

## 2018-07-01 MED ORDER — OXYCODONE HCL 5 MG PO TABS
5.0000 mg | ORAL_TABLET | Freq: Once | ORAL | Status: AC | PRN
  Administered 2018-07-01: 15:00:00 5 mg via ORAL

## 2018-07-01 MED ORDER — GLYCOPYRROLATE PF 0.2 MG/ML IJ SOSY
PREFILLED_SYRINGE | INTRAMUSCULAR | Status: AC
  Filled 2018-07-01: qty 1

## 2018-07-01 MED ORDER — LIDOCAINE 2% (20 MG/ML) 5 ML SYRINGE
INTRAMUSCULAR | Status: AC
  Filled 2018-07-01: qty 5

## 2018-07-01 MED ORDER — LIDOCAINE 2% (20 MG/ML) 5 ML SYRINGE
INTRAMUSCULAR | Status: DC | PRN
  Administered 2018-07-01: 60 mg via INTRAVENOUS

## 2018-07-01 MED ORDER — PROMETHAZINE HCL 25 MG/ML IJ SOLN: 6.2500 mg | INTRAMUSCULAR | Status: DC | PRN

## 2018-07-01 MED ORDER — GLYCOPYRROLATE PF 0.2 MG/ML IJ SOSY
PREFILLED_SYRINGE | INTRAMUSCULAR | Status: DC | PRN
  Administered 2018-07-01: .2 mg via INTRAVENOUS

## 2018-07-01 MED ORDER — ONDANSETRON HCL 4 MG/2ML IJ SOLN
INTRAMUSCULAR | Status: DC | PRN
  Administered 2018-07-01: 4 mg via INTRAVENOUS

## 2018-07-01 MED ORDER — METHOCARBAMOL 500 MG PO TABS
500.0000 mg | ORAL_TABLET | Freq: Four times a day (QID) | ORAL | Status: DC | PRN
  Administered 2018-07-01 – 2018-07-02 (×3): 500 mg via ORAL
  Filled 2018-07-01 (×4): qty 1

## 2018-07-01 MED ORDER — LEVOTHYROXINE SODIUM 25 MCG PO TABS
25.0000 ug | ORAL_TABLET | Freq: Every day | ORAL | Status: DC
  Administered 2018-07-02: 04:00:00 25 ug via ORAL
  Filled 2018-07-01: qty 1

## 2018-07-01 MED ORDER — THROMBIN (RECOMBINANT) 20000 UNITS EX SOLR
CUTANEOUS | Status: AC
  Filled 2018-07-01: qty 20000

## 2018-07-01 MED ORDER — ONDANSETRON HCL 4 MG PO TABS: 4.0000 mg | ORAL_TABLET | Freq: Four times a day (QID) | ORAL | Status: DC | PRN

## 2018-07-01 MED ORDER — FENTANYL CITRATE (PF) 250 MCG/5ML IJ SOLN
INTRAMUSCULAR | Status: DC | PRN
  Administered 2018-07-01 (×2): 50 ug via INTRAVENOUS
  Administered 2018-07-01: 100 ug via INTRAVENOUS

## 2018-07-01 MED ORDER — ROSUVASTATIN CALCIUM 5 MG PO TABS
5.0000 mg | ORAL_TABLET | Freq: Every day | ORAL | Status: DC
  Administered 2018-07-02: 5 mg via ORAL
  Filled 2018-07-01: qty 1

## 2018-07-01 MED ORDER — SODIUM CHLORIDE 0.9 % IV SOLN: 250.0000 mL | INTRAVENOUS | Status: DC

## 2018-07-01 MED ORDER — GABAPENTIN 300 MG PO CAPS
300.0000 mg | ORAL_CAPSULE | Freq: Three times a day (TID) | ORAL | Status: DC
  Administered 2018-07-01 – 2018-07-02 (×3): 300 mg via ORAL
  Filled 2018-07-01 (×3): qty 1

## 2018-07-01 MED ORDER — METHOCARBAMOL 500 MG PO TABS
ORAL_TABLET | ORAL | Status: AC
  Filled 2018-07-01: qty 1

## 2018-07-01 MED ORDER — CEFAZOLIN SODIUM-DEXTROSE 1-4 GM/50ML-% IV SOLN
1.0000 g | Freq: Three times a day (TID) | INTRAVENOUS | Status: AC
  Administered 2018-07-01 – 2018-07-02 (×2): 1 g via INTRAVENOUS
  Filled 2018-07-01 (×2): qty 50

## 2018-07-01 MED ORDER — ONDANSETRON HCL 4 MG PO TABS: 4.0000 mg | ORAL_TABLET | Freq: Three times a day (TID) | ORAL | 0 refills | Status: DC | PRN

## 2018-07-01 MED ORDER — METHYLPREDNISOLONE ACETATE 40 MG/ML IJ SUSP
INTRAMUSCULAR | Status: AC
  Filled 2018-07-01: qty 1

## 2018-07-01 MED ORDER — ACETAMINOPHEN 325 MG PO TABS: 650.0000 mg | ORAL_TABLET | ORAL | Status: DC | PRN

## 2018-07-01 MED ORDER — PROPOFOL 10 MG/ML IV BOLUS
INTRAVENOUS | Status: DC | PRN
  Administered 2018-07-01: 120 mg via INTRAVENOUS

## 2018-07-01 MED ORDER — SUCCINYLCHOLINE CHLORIDE 200 MG/10ML IV SOSY
PREFILLED_SYRINGE | INTRAVENOUS | Status: AC
  Filled 2018-07-01: qty 10

## 2018-07-01 MED ORDER — SODIUM CHLORIDE 0.9 % IV SOLN
INTRAVENOUS | Status: DC | PRN
  Administered 2018-07-01: 12:00:00 40 ug/min via INTRAVENOUS

## 2018-07-01 MED ORDER — ACETAMINOPHEN 650 MG RE SUPP: 650.0000 mg | RECTAL | Status: DC | PRN

## 2018-07-01 MED ORDER — PHENOL 1.4 % MT LIQD: 1.0000 | OROMUCOSAL | Status: DC | PRN

## 2018-07-01 MED ORDER — LACTATED RINGERS IV SOLN
INTRAVENOUS | Status: DC
  Administered 2018-07-01: 15:00:00 via INTRAVENOUS

## 2018-07-01 MED ORDER — TRANEXAMIC ACID-NACL 1000-0.7 MG/100ML-% IV SOLN
INTRAVENOUS | Status: DC | PRN
  Administered 2018-07-01: 1000 mg via INTRAVENOUS

## 2018-07-01 MED ORDER — PROPOFOL 10 MG/ML IV BOLUS
INTRAVENOUS | Status: AC
  Filled 2018-07-01: qty 20

## 2018-07-01 MED ORDER — TRANEXAMIC ACID-NACL 1000-0.7 MG/100ML-% IV SOLN
INTRAVENOUS | Status: AC
  Filled 2018-07-01: qty 100

## 2018-07-01 MED ORDER — OXYCODONE-ACETAMINOPHEN 10-325 MG PO TABS: 1.0000 | ORAL_TABLET | Freq: Four times a day (QID) | ORAL | 0 refills | Status: AC | PRN

## 2018-07-01 MED ORDER — OXYCODONE HCL 5 MG PO TABS
10.0000 mg | ORAL_TABLET | ORAL | Status: DC | PRN
  Administered 2018-07-01 – 2018-07-02 (×4): 10 mg via ORAL
  Filled 2018-07-01 (×4): qty 2

## 2018-07-01 MED ORDER — METHYLPREDNISOLONE ACETATE 40 MG/ML IJ SUSP
INTRAMUSCULAR | Status: DC | PRN
  Administered 2018-07-01: 40 mg

## 2018-07-01 MED ORDER — DEXAMETHASONE SODIUM PHOSPHATE 10 MG/ML IJ SOLN
INTRAMUSCULAR | Status: AC
  Filled 2018-07-01: qty 1

## 2018-07-01 MED ORDER — BUPIVACAINE-EPINEPHRINE 0.25% -1:200000 IJ SOLN
INTRAMUSCULAR | Status: DC | PRN
  Administered 2018-07-01: 16 mL

## 2018-07-01 MED ORDER — HYDROMORPHONE HCL 1 MG/ML IJ SOLN
INTRAMUSCULAR | Status: AC
  Filled 2018-07-01: qty 0.5

## 2018-07-01 MED ORDER — HEMOSTATIC AGENTS (NO CHARGE) OPTIME
TOPICAL | Status: DC | PRN
  Administered 2018-07-01: 2 via TOPICAL

## 2018-07-01 MED ORDER — 0.9 % SODIUM CHLORIDE (POUR BTL) OPTIME
TOPICAL | Status: DC | PRN
  Administered 2018-07-01 (×2): 1000 mL

## 2018-07-01 MED ORDER — SODIUM CHLORIDE 0.9% FLUSH: 3.0000 mL | INTRAVENOUS | Status: DC | PRN

## 2018-07-01 MED ORDER — OXYCODONE HCL 5 MG PO TABS
ORAL_TABLET | ORAL | Status: AC
  Filled 2018-07-01: qty 1

## 2018-07-01 MED ORDER — METHOCARBAMOL 1000 MG/10ML IJ SOLN
500.0000 mg | Freq: Four times a day (QID) | INTRAVENOUS | Status: DC | PRN
  Filled 2018-07-01: qty 5

## 2018-07-01 MED ORDER — MENTHOL 3 MG MT LOZG: 1.0000 | LOZENGE | OROMUCOSAL | Status: DC | PRN

## 2018-07-01 MED ORDER — ACETAMINOPHEN 10 MG/ML IV SOLN
INTRAVENOUS | Status: DC | PRN
  Administered 2018-07-01: 1000 mg via INTRAVENOUS

## 2018-07-01 MED ORDER — ONDANSETRON HCL 4 MG/2ML IJ SOLN
INTRAMUSCULAR | Status: AC
  Filled 2018-07-01: qty 2

## 2018-07-01 MED ORDER — ONDANSETRON HCL 4 MG/2ML IJ SOLN: 4.0000 mg | Freq: Four times a day (QID) | INTRAMUSCULAR | Status: DC | PRN

## 2018-07-01 MED ORDER — THROMBIN 20000 UNITS EX SOLR
CUTANEOUS | Status: DC | PRN
  Administered 2018-07-01: 20 mL

## 2018-07-01 MED ORDER — ARTIFICIAL TEARS OPHTHALMIC OINT
TOPICAL_OINTMENT | OPHTHALMIC | Status: AC
  Filled 2018-07-01: qty 3.5

## 2018-07-01 MED ORDER — OXYCODONE HCL 5 MG PO TABS
ORAL_TABLET | ORAL | Status: AC
  Filled 2018-07-01: qty 2

## 2018-07-01 MED ORDER — HYDROMORPHONE HCL 1 MG/ML IJ SOLN: 0.2500 mg | INTRAMUSCULAR | Status: DC | PRN

## 2018-07-01 MED ORDER — HYDROMORPHONE HCL 1 MG/ML IJ SOLN
INTRAMUSCULAR | Status: DC | PRN
  Administered 2018-07-01: 0.5 mg via INTRAVENOUS

## 2018-07-01 MED ORDER — SUCCINYLCHOLINE CHLORIDE 200 MG/10ML IV SOSY
PREFILLED_SYRINGE | INTRAVENOUS | Status: DC | PRN
  Administered 2018-07-01: 120 mg via INTRAVENOUS

## 2018-07-01 MED ORDER — BUPIVACAINE-EPINEPHRINE (PF) 0.25% -1:200000 IJ SOLN
INTRAMUSCULAR | Status: AC
  Filled 2018-07-01: qty 30

## 2018-07-01 MED ORDER — PROPOFOL 500 MG/50ML IV EMUL
INTRAVENOUS | Status: DC | PRN
  Administered 2018-07-01: 75 ug/kg/min via INTRAVENOUS

## 2018-07-01 MED ORDER — ACETAMINOPHEN 10 MG/ML IV SOLN
INTRAVENOUS | Status: AC
  Filled 2018-07-01: qty 100

## 2018-07-01 MED ORDER — DOCUSATE SODIUM 100 MG PO CAPS
100.0000 mg | ORAL_CAPSULE | Freq: Two times a day (BID) | ORAL | Status: DC
  Administered 2018-07-01 – 2018-07-02 (×2): 100 mg via ORAL
  Filled 2018-07-01 (×2): qty 1

## 2018-07-01 MED ORDER — LACTATED RINGERS IV SOLN
INTRAVENOUS | Status: DC
  Administered 2018-07-01 (×2): via INTRAVENOUS

## 2018-07-01 MED ORDER — DEXAMETHASONE SODIUM PHOSPHATE 10 MG/ML IJ SOLN
INTRAMUSCULAR | Status: DC | PRN
  Administered 2018-07-01: 5 mg via INTRAVENOUS

## 2018-07-01 SURGICAL SUPPLY — 74 items
BLADE CLIPPER SURG (BLADE) IMPLANT
BNDG COHESIVE 4X5 TAN STRL (GAUZE/BANDAGES/DRESSINGS) ×3 IMPLANT
BUR EGG ELITE 4.0 (BURR) ×2 IMPLANT
BUR EGG ELITE 4.0MM (BURR) ×1
CABLE BIPOLOR RESECTION CORD (MISCELLANEOUS) ×3 IMPLANT
CANISTER SUCT 3000ML PPV (MISCELLANEOUS) ×3 IMPLANT
CLIP NEUROVISION LG (CLIP) ×3 IMPLANT
CLOSURE STERI-STRIP 1/2X4 (GAUZE/BANDAGES/DRESSINGS) ×2
CLSR STERI-STRIP ANTIMIC 1/2X4 (GAUZE/BANDAGES/DRESSINGS) ×4 IMPLANT
CONT SPEC 4OZ CLIKSEAL STRL BL (MISCELLANEOUS) ×3 IMPLANT
COVER SURGICAL LIGHT HANDLE (MISCELLANEOUS) ×3 IMPLANT
COVER WAND RF STERILE (DRAPES) ×3 IMPLANT
DRAPE C-ARM 42X72 X-RAY (DRAPES) ×3 IMPLANT
DRAPE C-ARMOR (DRAPES) ×3 IMPLANT
DRAPE POUCH INSTRU U-SHP 10X18 (DRAPES) ×3 IMPLANT
DRAPE SURG 17X23 STRL (DRAPES) ×3 IMPLANT
DRAPE U-SHAPE 47X51 STRL (DRAPES) ×3 IMPLANT
DRSG OPSITE POSTOP 4X6 (GAUZE/BANDAGES/DRESSINGS) ×6 IMPLANT
DRSG OPSITE POSTOP 4X8 (GAUZE/BANDAGES/DRESSINGS) ×3 IMPLANT
DURAPREP 26ML APPLICATOR (WOUND CARE) ×3 IMPLANT
ELECT BLADE 4.0 EZ CLEAN MEGAD (MISCELLANEOUS) ×3
ELECT BLADE 6.5 EXT (BLADE) IMPLANT
ELECT PENCIL ROCKER SW 15FT (MISCELLANEOUS) ×3 IMPLANT
ELECT REM PT RETURN 9FT ADLT (ELECTROSURGICAL) ×3
ELECTRODE BLDE 4.0 EZ CLN MEGD (MISCELLANEOUS) ×1 IMPLANT
ELECTRODE REM PT RTRN 9FT ADLT (ELECTROSURGICAL) ×1 IMPLANT
GLOVE BIOGEL PI IND STRL 8.5 (GLOVE) ×1 IMPLANT
GLOVE BIOGEL PI INDICATOR 8.5 (GLOVE) ×2
GLOVE SS BIOGEL STRL SZ 8.5 (GLOVE) ×2 IMPLANT
GLOVE SUPERSENSE BIOGEL SZ 8.5 (GLOVE) ×4
GOWN STRL REUS W/ TWL LRG LVL3 (GOWN DISPOSABLE) ×3 IMPLANT
GOWN STRL REUS W/TWL 2XL LVL3 (GOWN DISPOSABLE) ×3 IMPLANT
GOWN STRL REUS W/TWL LRG LVL3 (GOWN DISPOSABLE) ×6
GUIDEWIRE NITINOL BEVEL TIP (WIRE) ×12 IMPLANT
IV CATH 14GX2 1/4 (CATHETERS) ×3 IMPLANT
KIT BASIN OR (CUSTOM PROCEDURE TRAY) ×3 IMPLANT
KIT POSITION SURG JACKSON T1 (MISCELLANEOUS) IMPLANT
KIT TURNOVER KIT B (KITS) ×3 IMPLANT
MODULE EMG NEEDLE SSEP NVM5 (NEEDLE) ×3 IMPLANT
MODULE NVM5 NEXT GEN EMG (NEEDLE) ×3 IMPLANT
NEEDLE 22X1 1/2 (OR ONLY) (NEEDLE) ×6 IMPLANT
NEEDLE SPNL 18GX3.5 QUINCKE PK (NEEDLE) ×6 IMPLANT
NS IRRIG 1000ML POUR BTL (IV SOLUTION) ×3 IMPLANT
PACK LAMINECTOMY ORTHO (CUSTOM PROCEDURE TRAY) ×3 IMPLANT
PACK UNIVERSAL I (CUSTOM PROCEDURE TRAY) ×3 IMPLANT
PAD ARMBOARD 7.5X6 YLW CONV (MISCELLANEOUS) ×6 IMPLANT
PATTIES SURGICAL .5 X1 (DISPOSABLE) ×3 IMPLANT
POSITIONER HEAD PRONE TRACH (MISCELLANEOUS) ×3 IMPLANT
PROBE BALL TIP NVM5 SNG USE (BALLOONS) ×3 IMPLANT
PUTTY BONE DBX 5CC MIX (Putty) ×3 IMPLANT
REDUCTION EXT RELINE MAS MOD (Neuro Prosthesis/Implant) ×9 IMPLANT
ROD RELINE MAS TI LORD 5.5X40 (Rod) ×6 IMPLANT
SCREW LOCK RELINE 5.5 TULIP (Screw) ×12 IMPLANT
SCREW RELINE MAS POLY 6.5X40MM (Screw) ×3 IMPLANT
SCREW SHANK MAS MOD 6.5X40MM (Screw) ×9 IMPLANT
SPONGE LAP 4X18 RFD (DISPOSABLE) ×9 IMPLANT
SPONGE SURGIFOAM ABS GEL 100 (HEMOSTASIS) ×3 IMPLANT
SURGIFLO W/THROMBIN 8M KIT (HEMOSTASIS) ×6 IMPLANT
SUT BONE WAX W31G (SUTURE) ×3 IMPLANT
SUT MON AB 3-0 SH 27 (SUTURE) ×4
SUT MON AB 3-0 SH27 (SUTURE) ×2 IMPLANT
SUT VIC AB 0 CT1 27 (SUTURE) ×4
SUT VIC AB 0 CT1 27XBRD ANBCTR (SUTURE) ×2 IMPLANT
SUT VIC AB 1 CT1 18XCR BRD 8 (SUTURE) ×2 IMPLANT
SUT VIC AB 1 CT1 8-18 (SUTURE) ×4
SUT VIC AB 2-0 CT1 18 (SUTURE) ×3 IMPLANT
SYR BULB IRRIGATION 50ML (SYRINGE) ×3 IMPLANT
SYR CONTROL 10ML LL (SYRINGE) ×3 IMPLANT
SYR TB 1ML LUER SLIP (SYRINGE) ×3 IMPLANT
TOWEL GREEN STERILE (TOWEL DISPOSABLE) ×3 IMPLANT
TOWEL GREEN STERILE FF (TOWEL DISPOSABLE) ×3 IMPLANT
TRAY FOLEY MTR SLVR 16FR STAT (SET/KITS/TRAYS/PACK) ×3 IMPLANT
WATER STERILE IRR 1000ML POUR (IV SOLUTION) ×3 IMPLANT
YANKAUER SUCT BULB TIP NO VENT (SUCTIONS) ×3 IMPLANT

## 2018-07-01 NOTE — Evaluation (Signed)
Physical Therapy Evaluation Patient Details Name: Brittany Ashley MRN: [id number] DOB: 1940-06-16 Today's Date: 07/01/2018   History of Present Illness  Pt is a 78 y/o female s/p L4-5 PLIF. PMH includes lumbar surgery, DDD, coronary atherosclerosis.   Clinical Impression  Patient is s/p above surgery resulting in the deficits listed below (see PT Problem List). Pt tolerated ambulation well. Required min guard to min A for steadying assist. Educated about back precautions and generalized walking program. Patient will benefit from skilled PT to increase their independence and safety with mobility (while adhering to their precautions) to allow discharge to the venue listed below.     Follow Up Recommendations No PT follow up;Supervision for mobility/OOB    Equipment Recommendations  None recommended by PT    Recommendations for Other Services       Precautions / Restrictions Precautions Precautions: Back Precaution Booklet Issued: Yes (comment) Precaution Comments: Reviewed back precautions with pt.  Required Braces or Orthoses: Spinal Brace Spinal Brace: Lumbar corset;Applied in sitting position Restrictions Weight Bearing Restrictions: No      Mobility  Bed Mobility Overal bed mobility: Needs Assistance Bed Mobility: Rolling;Sidelying to Sit Rolling: Min guard Sidelying to sit: Min guard       General bed mobility comments: Min guard to ensure use of log roll technique. Cues for sequencing.   Transfers Overall transfer level: Needs assistance Equipment used: 1 person hand held assist Transfers: Sit to/from Stand Sit to Stand: Min assist         General transfer comment: Min A for lift assist and steadying.   Ambulation/Gait Ambulation/Gait assistance: Min guard Gait Distance (Feet): 125 Feet Assistive device: 1 person hand held assist Gait Pattern/deviations: Step-through pattern;Decreased stride length Gait velocity: Decreased    General Gait Details: Slow,  guarded gait. Short step length. Cues for increased step length. Min guard for steadying assist.   Stairs            Wheelchair Mobility    Modified Rankin (Stroke Patients Only)       Balance Overall balance assessment: Needs assistance Sitting-balance support: No upper extremity supported;Feet supported       Standing balance support: Single extremity supported Standing balance-Leahy Scale: Poor Standing balance comment: Reliant on at least 1 UE support.                              Pertinent Vitals/Pain Pain Assessment: Faces Faces Pain Scale: Hurts little more Pain Location: back  Pain Descriptors / Indicators: Aching;Operative site guarding Pain Intervention(s): Limited activity within patient's tolerance;Monitored during session;Repositioned    Home Living Family/patient expects to be discharged to:: Private residence Living Arrangements: Spouse/significant other Available Help at Discharge: Family;Available 24 hours/day Type of Home: House Home Access: Stairs to enter Entrance Stairs-Rails: None Entrance Stairs-Number of Steps: 2 Home Layout: Two level;Able to live on main level with bedroom/bathroom Home Equipment: Kasandra Knudsen - single point;Bedside commode;Walker - 2 wheels Additional Comments: Reports son and sister will be staying with pt.     Prior Function Level of Independence: Independent         Comments: was walking 2 miles each day     Hand Dominance        Extremity/Trunk Assessment   Upper Extremity Assessment Upper Extremity Assessment: Defer to OT evaluation    Lower Extremity Assessment Lower Extremity Assessment: Generalized weakness    Cervical / Trunk Assessment Cervical / Trunk Assessment: Other exceptions Cervical /  Trunk Exceptions: s/p lumbar surgery   Communication   Communication: No difficulties  Cognition Arousal/Alertness: Awake/alert Behavior During Therapy: WFL for tasks assessed/performed Overall  Cognitive Status: Within Functional Limits for tasks assessed                                        General Comments General comments (skin integrity, edema, etc.): Educated about generalized walking program to perform at home.     Exercises     Assessment/Plan    PT Assessment Patient needs continued PT services  PT Problem List Decreased strength;Decreased balance;Decreased mobility;Decreased knowledge of use of DME;Decreased knowledge of precautions;Pain       PT Treatment Interventions DME instruction;Gait training;Stair training;Functional mobility training;Therapeutic activities;Therapeutic exercise;Balance training;Patient/family education    PT Goals (Current goals can be found in the Care Plan section)  Acute Rehab PT Goals Patient Stated Goal: to get back to walking PT Goal Formulation: With patient Time For Goal Achievement: 07/15/18 Potential to Achieve Goals: Good    Frequency Min 5X/week   Barriers to discharge        Co-evaluation               AM-PAC PT "6 Clicks" Mobility  Outcome Measure Help needed turning from your back to your side while in a flat bed without using bedrails?: A Little Help needed moving from lying on your back to sitting on the side of a flat bed without using bedrails?: A Little Help needed moving to and from a bed to a chair (including a wheelchair)?: A Little Help needed standing up from a chair using your arms (e.g., wheelchair or bedside chair)?: A Little Help needed to walk in hospital room?: A Little Help needed climbing 3-5 steps with a railing? : A Lot 6 Click Score: 17    End of Session Equipment Utilized During Treatment: Gait belt;Back brace Activity Tolerance: Patient limited by pain Patient left: in bed;with call bell/phone within reach(sitting EOB) Nurse Communication: Mobility status PT Visit Diagnosis: Unsteadiness on feet (R26.81);Muscle weakness (generalized) (M62.81);Pain Pain - part of  body: (back)    Time: 5462-7035 PT Time Calculation (min) (ACUTE ONLY): 18 min   Charges:   PT Evaluation $PT Eval Low Complexity: [place], PT, DPT  Acute Rehabilitation Services  Pager: [phone] Office: [phone]   Rudean Hitt 07/01/2018, 6:40 PM

## 2018-07-01 NOTE — H&P (Signed)
Addendum H&P  No significant change in clinical exam from last office visit of 06/25/2018  Patient continues to have severe debilitating radicular left leg pain secondary to a foraminal disc herniation at L4-5.  Patient has had a previous L4-5 decompression and now had a recurrent far lateral disc herniation causing L4 nerve compression.  Attempts at conservative management had failed to alleviate her symptoms.  Plan is to do a revision decompression.  This will be a far lateral pars resection to remove the disc fragment to alleviate the L4 radicular pain.  Given the previous surgery and the potential for instability I would then supplement this with a posterior lateral arthrodesis and instrumentation.  Patient is aware of the surgical procedure as well as the risks and benefits and all of her questions were addressed.

## 2018-07-01 NOTE — Anesthesia Procedure Notes (Signed)
Procedure Name: Intubation Date/Time: 07/01/2018 11:48 AM Performed by: Teressa Lower., CRNA Pre-anesthesia Checklist: Patient identified, Emergency Drugs available, Suction available and Patient being monitored Patient Re-evaluated:Patient Re-evaluated prior to induction Oxygen Delivery Method: Circle system utilized Preoxygenation: Pre-oxygenation with 100% oxygen Induction Type: IV induction Ventilation: Mask ventilation without difficulty Laryngoscope Size: Miller and 2 Grade View: Grade I Tube type: Oral Tube size: 7.0 mm Number of attempts: 1 Airway Equipment and Method: Stylet Placement Confirmation: ETT inserted through vocal cords under direct vision,  positive ETCO2 and breath sounds checked- equal and bilateral Secured at: 22 cm Tube secured with: Tape Dental Injury: Teeth and Oropharynx as per pre-operative assessment

## 2018-07-01 NOTE — Discharge Instructions (Signed)

## 2018-07-01 NOTE — Brief Op Note (Signed)
07/01/2018  2:51 PM  PATIENT:  William Dalton  78 y.o. female  PRE-OPERATIVE DIAGNOSIS:  Recurrent Left far lateral L4-5 herniated disc  POST-OPERATIVE DIAGNOSIS:  Recurrent Left far lateral L4-5 herniated disc  PROCEDURE:  Procedure(s) with comments: Revision Left L4-5 disectomy, posterior spinal fusion interbody L4-5 (N/A) - 3 hrs  SURGEON:  Surgeon(s) and Role:    Melina Schools, MD - Primary  PHYSICIAN ASSISTANT:   ASSISTANTS: Amanda Ward, PA   ANESTHESIA:   general  EBL:  100 mL   BLOOD ADMINISTERED:none  DRAINS: none   LOCAL MEDICATIONS USED:  MARCAINE     SPECIMEN:  No Specimen  DISPOSITION OF SPECIMEN:  N/A  COUNTS:  YES  TOURNIQUET:  * No tourniquets in log *  DICTATION: .Dragon Dictation  PLAN OF CARE: Admit for overnight observation  PATIENT DISPOSITION:  PACU - hemodynamically stable.

## 2018-07-01 NOTE — Op Note (Signed)
Operative report  Preoperative diagnosis: Far lateral L4-5 left disc herniation with left L4 radiculopathy.  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  Postoperative diagnosis: Same  Operative procedure:  1. Revision L4-5 far lateral discectomy.  Left L4-5 lateral pars resection and foraminotomy with excision of disc fragment.   2. Posterior lateral arthrodesis L4-5.   3.Posterior spinal fusion with pedicle screw instrumentation T0-2  Complications: None  First assistant: Cleta Alberts, PA  Implants: NuVasive MIS pedicle screws.  6.5 x 40 mm (x4 screws)  Allograft: DBXMix  Neuro monitoring: Left L4 pedicle screw: No abnormally EMG activity with greater than 40 mA stimulation.  Left L5 pedicle screw: Positive EMG activity at 5 mA stimulation.   Right L4 and L5 pedicle screws: No abnormal activity at greater than 40 mA stimulation.  No abnormal EMG/SSEP activity throughout the case.  Indications: Ms. Dalby is a very pleasant 78 year old woman who in January 2020 underwent a L4-5 decompression discectomy for a disc herniation with radicular leg pain.  Initially the patient was quite well and then began having recurrent severe debilitating leg pain.  Repeat imaging studies demonstrated recurrent large foraminal disc herniation at L4-5 on the left side.  This was causing marked compression of the exiting L4 nerve root.  As result of the failure to improve with conservative measures we elected to move forward with a revision surgery.  All appropriate risks benefits and alternatives were discussed with the patient and her family and consent was obtained.  Operative report patient is brought the operating room placed upon the operating room table.  After successful induction of general anesthesia and endotracheal ovation teds SCDs and a Foley were inserted.  Patient was turned prone onto the Wilson frame and all bony prominences well-padded.  Back was then prepped and draped in a standard fashion.   Timeout was taken to confirm patient procedure and all other important data.  Once this was completed we then began the surgical procedure.  Using fluoroscopy identified the lateral border of the right L4 and L5 pedicle marked there area.  I infiltrated with quarter percent Marcaine with epinephrine and then made a single Wiltsie type incision and bluntly dissected down to the deep fascia.  I then advanced the Jamshidi needle to the lateral aspect of the L4 pedicle and using fluoroscopy as well as direct stimulation of the Jamshidi needle I advanced it into the L4 pedicle using the AP view as I near the medial border I then checked the lateral to ensure that it was just beyond the posterior wall the vertebral body.  Having confirmed trajectory and position I then advanced into the L4 vertebral body I then placed the guidepin and remove the Jamshidi needle.  I repeated this at L5.  With the right L4 and 5 pedicles cannulated I then moved my attention to the left side.  A Wiltsie incision was made and I sharply dissected down to the deep fascia  I incised the deep fascia and then bluntly dissected through the spinal muscle until I could palpate the lateral aspect of the L4-5 and the L3-4 facets.  Once I was able to do this I used a Cobb and curette to expose the lateral aspect of the facet complex and the L4 and L5 transverse processes.  I was then able to dissect over the pars and remove the scar tissue.  At this point I could now see the lateral aspect of the pars and the transverse process and facet complex.  I then  used a curved nerve curette to dissect underneath the pars and then I used my 2 and 3 mm Kerrison punch punch to resect the lateral border of the pars.  I then used a Penfield 4 to dissect through the ligamentum flavum and I remove this.  I could now clearly visualize the exiting L4 nerve root.  I continued to dissect down keeping the nerve root superior until I could see and palpate the disc space.   At this point I widen my foraminotomies and then protected the nerve root with a neuro patty.  I incised the disc fragment and using my nerve hook I delivered multiple large fragments of disc material out they were then removed with the micropituitary rongeurs.  I continued doing a full discectomy by working in the subannular space.  I swept circumferentially removing the fragments of disc.  At the conclusion of the discectomy I felt as though the amount of disc material that was removed was consistent with what was seen on the preoperative MRI.  In addition I made sure I had decompressed the area where there was maximum neural compression based on the preoperative MRI.  I was able to easily sweep from the medial aspect of the L4 pedicle down to the medial aspect of the L5 pedicle.  In addition I could clearly visualize the L4 nerve root and it was freely mobile at this point time and I can easily mobilize it in the foramen and I was able to palpate up into the lateral recess at the medial aspect of the pedicle confirming it was adequately decompressed.  In inspecting the preoperative MRI the area of maximum compression to the L4 nerve root was at the level of the disc space and this was completely decompressed.  At this point with the discectomy complete I irrigated the wound copiously with normal saline.  Using the same technique as used on the right-hand side I cannulated the L4 and L5 pedicles.  I then placed the appropriate size pedicle screws.  I then directly stimulated each of the 4 pedicle screws and they were adequate indicating no evidence of breach.  On the left-hand side I then used a high-speed bur to decorticate the neural aspect of the facet complex and transverse process.  I then packed DBX mix allograft into the posterior lateral gutter.  I then measured and placed the appropriate size rod and secured to the pedicle screw construct.  The top locking nuts were torqued off according manufacturer  standards.  On the left-hand side after final irrigation I did place proximally half a cc (20 mg) of Depo-Medrol over the nerve to aid in postoperative analgesia due to the neurocompression from the disc herniation and the gentle manipulation that was required to safely perform the surgery.  Hemostasis was achieved using bipolar cautery and FloSeal.  The screws were then placed on the left-hand side as was the rod.  Locking knots were then placed and torqued off according manufacturer standards.  At this time the wounds were closed in a similar fashion with interrupted #1 Vicryl suture.  A running 0 Vicryl suture, 2-0 Vicryl suture, and 3-0 Monocryl for the skin.  Steri-Strips and dry dressings were applied and the patient was ultimately extubated and transferred the PACU without incident.  The end of the case all needle sponge counts were correct.

## 2018-07-01 NOTE — Transfer of Care (Signed)
Immediate Anesthesia Transfer of Care Note  Patient: Brittany Ashley  Procedure(s) Performed: Revision Left L4-5 disectomy, posterior spinal fusion interbody L4-5 (N/A Spine Lumbar)  Patient Location: PACU  Anesthesia Type:General  Level of Consciousness: awake, alert  and oriented  Airway & Oxygen Therapy: Patient Spontanous Breathing and Patient connected to face mask oxygen  Post-op Assessment: Report given to RN and Post -op Vital signs reviewed and stable  Post vital signs: Reviewed and stable  Last Vitals:  Vitals Value Taken Time  BP 121/68 07/01/18 1501  Temp    Pulse 78 07/01/18 1505  Resp 17 07/01/18 1505  SpO2 100 % 07/01/18 1505  Vitals shown include unvalidated device data.  Last Pain:  Vitals:   07/01/18 1029  TempSrc:   PainSc: 5          Complications: No apparent anesthesia complications

## 2018-07-02 MED FILL — Thrombin (Recombinant) For Soln 20000 Unit: CUTANEOUS | Qty: 1 | Status: AC

## 2018-07-02 NOTE — Anesthesia Postprocedure Evaluation (Signed)
Anesthesia Post Note  Patient: Brittany Ashley  Procedure(s) Performed: Revision Left L4-5 disectomy, posterior spinal fusion interbody L4-5 (N/A Spine Lumbar)     Patient location during evaluation: PACU Anesthesia Type: General Level of consciousness: awake and alert Pain management: pain level controlled Vital Signs Assessment: post-procedure vital signs reviewed and stable Respiratory status: spontaneous breathing, nonlabored ventilation and respiratory function stable Cardiovascular status: blood pressure returned to baseline and stable Postop Assessment: no apparent nausea or vomiting Anesthetic complications: no    Last Vitals:  Vitals:   07/02/18 0321 07/02/18 0722  BP: (!) 144/78 135/64  Pulse: 76 75  Resp: 18 18  Temp: 36.5 C 36.7 C  SpO2: 100%     Last Pain:  Vitals:   07/02/18 0722  TempSrc: Oral  PainSc:                  Lynda Rainwater

## 2018-07-02 NOTE — Progress Notes (Signed)
Discharge instruction/education/AVS/Rx given to patient and verbalized understanding. Pain is mild to moderate and controlled by PRN medications. No swelling, no redness, no drainage noted on incision site. Patient eating well with no complaints. Patient voiding well, and ambulating well. Discharged via wheelchair.

## 2018-07-02 NOTE — Progress Notes (Signed)
Physical Therapy Treatment Patient Details Name: Brittany Ashley MRN: [id number] DOB: 1940/03/28 Today's Date: 07/02/2018    History of Present Illness Pt is a 78 y/o female s/p L4-5 PLIF. PMH includes lumbar surgery, DDD, coronary atherosclerosis.     PT Comments    Pt progressing well with post-op mobility. Pt reports feeling surprised by how good she felt during gait training. With support at home, feel pt is functionally ready for d/c from a PT standpoint. Pt reports she is awaiting results from sister's Covid test prior to having consistent help at home. Pt was educated on stair negotiation, precautions, brace application/wearing schedule, activity progression, and car transfer. Will continue to follow and progress as able per POC.     Follow Up Recommendations  No PT follow up;Supervision for mobility/OOB     Equipment Recommendations  None recommended by PT    Recommendations for Other Services       Precautions / Restrictions Precautions Precautions: Back Precaution Booklet Issued: Yes (comment) Precaution Comments: Reviewed back precautions with pt.  Required Braces or Orthoses: Spinal Brace Spinal Brace: Lumbar corset;Applied in sitting position Restrictions Weight Bearing Restrictions: No    Mobility  Bed Mobility Overal bed mobility: Needs Assistance Bed Mobility: Rolling;Sidelying to Sit Rolling: Modified independent (Device/Increase time) Sidelying to sit: Supervision       General bed mobility comments: Pt able to transition to EOB without assistance. Supervision for safety and VC's for proper log roll technique provided.  Transfers Overall transfer level: Needs assistance Equipment used: Straight cane Transfers: Sit to/from Stand Sit to Stand: Supervision         General transfer comment: for safety  Ambulation/Gait Ambulation/Gait assistance: Min guard Gait Distance (Feet): 300 Feet Assistive device: Straight cane Gait Pattern/deviations:  Step-through pattern;Decreased stride length Gait velocity: Decreased  Gait velocity interpretation: 1.31 - 2.62 ft/sec, indicative of limited community ambulator General Gait Details: Slow, guarded gait. Short step length. Cues for increased step length. Min guard for steadying assist.    Stairs Stairs: Yes Stairs assistance: Min assist Stair Management: One rail Left;Step to pattern;Forwards Number of Stairs: 3 General stair comments: VC's for sequencing and min assist for balance support/safety.    Wheelchair Mobility    Modified Rankin (Stroke Patients Only)       Balance Overall balance assessment: Needs assistance Sitting-balance support: No upper extremity supported;Feet supported Sitting balance-Leahy Scale: Good     Standing balance support: Single extremity supported Standing balance-Leahy Scale: Fair Standing balance comment: Reliant on at least 1 UE support.                             Cognition Arousal/Alertness: Awake/alert Behavior During Therapy: WFL for tasks assessed/performed Overall Cognitive Status: Within Functional Limits for tasks assessed                                        Exercises      General Comments General comments (skin integrity, edema, etc.): Education provided for LB AE and pt ensured OTR that she has a reacher at home and is aware of sock aid. Pt reports that she is able to lay in bed and sometimes flex at hip to don socks.       Pertinent Vitals/Pain Pain Assessment: Faces Pain Score: 2  Faces Pain Scale: Hurts little more Pain Location: back  Pain  Descriptors / Indicators: Aching;Operative site guarding Pain Intervention(s): Monitored during session    [place] expects to be discharged to:: Private residence Living Arrangements: Spouse/significant other Available Help at Discharge: Family;Available 24 hours/day Type of Home: House Home Access: Stairs to enter Entrance  Stairs-Rails: None Home Layout: Two level;Able to live on main level with bedroom/bathroom Home Equipment: Kasandra Knudsen - single point;Bedside commode;Walker - 2 wheels Additional Comments: Reports son and sister will be staying with pt.     Prior Function Level of Independence: Independent      Comments: was walking 2 miles each day   PT Goals (current goals can now be found in the care plan section) Acute Rehab PT Goals Patient Stated Goal: to get back to walking PT Goal Formulation: With patient Time For Goal Achievement: 07/15/18 Potential to Achieve Goals: Good Progress towards PT goals: Progressing toward goals    Frequency    Min 5X/week      PT Plan Current plan remains appropriate    Co-evaluation              AM-PAC PT "6 Clicks" Mobility   Outcome Measure  Help needed turning from your back to your side while in a flat bed without using bedrails?: A Little Help needed moving from lying on your back to sitting on the side of a flat bed without using bedrails?: A Little Help needed moving to and from a bed to a chair (including a wheelchair)?: A Little Help needed standing up from a chair using your arms (e.g., wheelchair or bedside chair)?: A Little Help needed to walk in hospital room?: A Little Help needed climbing 3-5 steps with a railing? : A Lot 6 Click Score: 17    End of Session Equipment Utilized During Treatment: Gait belt;Back brace Activity Tolerance: Patient limited by pain Patient left: in bed;with call bell/phone within reach(sitting EOB) Nurse Communication: Mobility status PT Visit Diagnosis: Unsteadiness on feet (R26.81);Muscle weakness (generalized) (M62.81);Pain Pain - part of body: (back)     Time: 1829-9371 PT Time Calculation (min) (ACUTE ONLY): 20 min  Charges:  $Gait Training: 8-22 mins                     Rolinda Roan, PT, DPT Acute Rehabilitation Services Pager: [phone] Office: [phone]    Thelma Comp 07/02/2018, 10:43 AM

## 2018-07-02 NOTE — Progress Notes (Addendum)
Subjective: 1 Day Post-Op Procedure(s) (LRB): Revision Left L4-5 disectomy, posterior spinal fusion interbody L4-5 (N/A) Patient reports pain as mild.  Denies any left leg pain. Mild pain at incisional sites only. Tolerating PO well without any N/V. +void +flatus +ambulation Denies HA, dizziness, CP, SOB, calf pain, sweats/chills.  Objective: Vital signs in last 24 hours: Temp:  [97.2 F (36.2 C)-98.1 F (36.7 C)] 98.1 F (36.7 C) (06/12 0722) Pulse Rate:  [58-83] 75 (06/12 0722) Resp:  [16-29] 18 (06/12 0722) BP: (115-167)/(61-88) 135/64 (06/12 0722) SpO2:  [94 %-100 %] 100 % (06/12 0321) Weight:  [59 kg] 59 kg (06/11 0906)  Intake/Output from previous day: 06/11 0701 - 06/12 0700 In: 1500 [I.V.:1500] Out: 450 [Urine:350; Blood:100] Intake/Output this shift: No intake/output data recorded.  Recent Labs    06/29/18 1048  HGB 12.8   Recent Labs    06/29/18 1048  WBC 4.9  RBC 4.24  HCT 39.9  PLT 196   Recent Labs    06/29/18 1048  NA 140  K 4.0  CL 108  CO2 24  BUN 9  CREATININE 0.63  GLUCOSE 103*  CALCIUM 9.8   Recent Labs    06/29/18 1048  INR 1.1   Negative left SLR Neurologically intact ABD soft Neurovascular intact Sensation intact distally Intact pulses distally Dorsiflexion/Plantar flexion intact Incision: dressing C/D/I No cellulitis present Compartment soft  Assessment/Plan: 1 Day Post-Op Procedure(s) (LRB): Revision Left L4-5 disectomy, posterior spinal fusion interbody L4-5 (N/A)   DVT ppx: SCD's, TEDs, ambulation Encourage IS Advance diet Up with therapy Plan for discharge tomorrow (home). Will have family support sister (will stay with her in the home), daughter, son. Husband has Parkinson's & dimentia. Per patient request sister having COVID-19 test prior to staying with her-- awaiting results, should result today. Do not anticipate any issues, but if patient does not have appropriate help at home may have to consider SNF.    Addendum dictation: I received a call from nursing staff this morning after rounding and completing this note and patient states that her sister's COVID-19 test has resulted as negative and that she will be available to pick her up and take care of her at home starting today.  Patient would like to be discharged today.  There are no reasons at this point that I would recommend the patient stay another day therefore I will put in an order for discharge.    Yvonne Kendall Ward 07/02/2018, 8:09 AM

## 2018-07-02 NOTE — Evaluation (Signed)
Occupational Therapy Evaluation Patient Details Name: Brittany Ashley MRN: [id number] DOB: 1940-05-20 Today's Date: 07/02/2018    History of Present Illness Pt is a 78 y/o female s/p L4-5 PLIF. PMH includes lumbar surgery, DDD, coronary atherosclerosis.    Clinical Impression   Pt PTA living with spouse with dementia and Parkinson's dx. Pt currently performing ADL functional transfers and ADL functional mobility with SPC and supervisionA. Pt set-upA for UB ADL and minA for LB ADL - pt has reacher at home. Education provided for AE available. Pt unable to reach feet with figure 4 technique. Pt stood at sink for x8 mins for grooming tasks with supervisionA and gathered materials from bag. OTR verbally instructed pt on back precautions. Pt would benefit from continued OT skilled services for ADL, mobility and safety. No OT follow-up required. Pt plans to have family stay with her 24/7. OT following acutely.      Follow Up Recommendations  No OT follow up;Supervision - Intermittent    Equipment Recommendations  None recommended by OT    Recommendations for Other Services       Precautions / Restrictions Precautions Precautions: Back Precaution Booklet Issued: Yes (comment) Precaution Comments: Reviewed back precautions with pt.  Required Braces or Orthoses: Spinal Brace Spinal Brace: Lumbar corset;Other (comment)(already wearing it) Restrictions Weight Bearing Restrictions: No      Mobility Bed Mobility Overal bed mobility: Needs Assistance             General bed mobility comments: sitting in recliner upon arrival  Transfers Overall transfer level: Needs assistance Equipment used: Straight cane Transfers: Sit to/from Stand Sit to Stand: Supervision         General transfer comment: for safety    Balance Overall balance assessment: Needs assistance Sitting-balance support: No upper extremity supported;Feet supported Sitting balance-Leahy Scale: Good      Standing balance support: Single extremity supported Standing balance-Leahy Scale: Fair Standing balance comment: Reliant on at least 1 UE support.                            ADL either performed or assessed with clinical judgement   ADL Overall ADL's : Needs assistance/impaired Eating/Feeding: Modified independent;Sitting   Grooming: Set up;Standing   Upper Body Bathing: Set up;Sitting   Lower Body Bathing: Minimal assistance;Sitting/lateral leans;Sit to/from stand;With adaptive equipment   Upper Body Dressing : Set up;Sitting;Standing   Lower Body Dressing: Minimal assistance;With adaptive equipment;Sitting/lateral leans;Sit to/from stand   Toilet Transfer: Minimal assistance;Cueing for safety;Cueing for sequencing;Grab bars   Toileting- Clothing Manipulation and Hygiene: Min guard;Sitting/lateral lean;Sit to/from stand       Functional mobility during ADLs: Supervision/safety;Cane General ADL Comments: Pt performing ADL functional mobility with minguardA with SPC and set-upA for UB ADL and minA with LB ADL as pt has reachr at home, but will requires assist with ted hose as pt unable to perform figure 4 technique.     Vision Baseline Vision/History: No visual deficits Vision Assessment?: No apparent visual deficits     Perception     Praxis      Pertinent Vitals/Pain Pain Assessment: 0-10 Pain Score: 2  Pain Location: back  Pain Descriptors / Indicators: Aching;Operative site guarding Pain Intervention(s): RN gave pain meds during session;Monitored during session;Repositioned     Hand Dominance     Extremity/Trunk Assessment Upper Extremity Assessment Upper Extremity Assessment: Overall WFL for tasks assessed   Lower Extremity Assessment Lower Extremity Assessment: Generalized weakness;RLE deficits/detail;LLE  deficits/detail RLE Deficits / Details: unable to lift for figure four technique in sitting LLE Deficits / Details: unable to lift for  figure four technique in sitting   Cervical / Trunk Assessment Cervical / Trunk Assessment: Other exceptions Cervical / Trunk Exceptions: s/p lumbar surgery    Communication Communication Communication: No difficulties   Cognition Arousal/Alertness: Awake/alert Behavior During Therapy: WFL for tasks assessed/performed Overall Cognitive Status: Within Functional Limits for tasks assessed                                     General Comments  Education provided for LB AE and pt ensured OTR that she has a reacher at home and is aware of sock aid. Pt reports that she is able to lay in bed and sometimes flex at hip to don socks.     Exercises     Shoulder Instructions      Home Living Family/patient expects to be discharged to:: Private residence Living Arrangements: Spouse/significant other Available Help at Discharge: Family;Available 24 hours/day Type of Home: House Home Access: Stairs to enter [employer] of Steps: 2 Entrance Stairs-Rails: None Home Layout: Two level;Able to live on main level with bedroom/bathroom     Bathroom Shower/Tub: Occupational psychologist: Standard     Home Equipment: Cane - single point;Bedside commode;Walker - 2 wheels   Additional Comments: Reports son and sister will be staying with pt.       Prior Functioning/Environment Level of Independence: Independent        Comments: was walking 2 miles each day        OT Problem List: Decreased strength;Decreased activity tolerance;Impaired balance (sitting and/or standing);Decreased safety awareness;Pain      OT Treatment/Interventions: Self-care/ADL training;Therapeutic exercise;Neuromuscular education;Energy conservation;DME and/or AE instruction;Therapeutic activities;Patient/family education;Balance training    OT Goals(Current goals can be found in the care plan section) Acute Rehab OT Goals Patient Stated Goal: to get back to walking OT Goal  Formulation: With patient Time For Goal Achievement: 07/16/18 Potential to Achieve Goals: Good ADL Goals Pt Will Perform Toileting - Clothing Manipulation and hygiene: (P) with modified independence;sit to/from stand Additional ADL Goal #1: (P) Pt will be modified independent for OOB ADL  OT Frequency: Min 2X/week   Barriers to D/C:            Co-evaluation              AM-PAC OT "6 Clicks" Daily Activity     Outcome Measure Help from another person eating meals?: None Help from another person taking care of personal grooming?: A Little Help from another person toileting, which includes using toliet, bedpan, or urinal?: A Little Help from another person bathing (including washing, rinsing, drying)?: A Little Help from another person to put on and taking off regular upper body clothing?: None Help from another person to put on and taking off regular lower body clothing?: A Little 6 Click Score: 20   End of Session Equipment Utilized During Treatment: Other (comment)(SPC) Nurse Communication: Mobility status  Activity Tolerance: Patient tolerated treatment well Patient left: in chair;with call bell/phone within reach  OT Visit Diagnosis: Unsteadiness on feet (R26.81);Muscle weakness (generalized) (M62.81)                Time: 7494-4967 OT Time Calculation (min): 23 min Charges:  OT General Charges $OT Visit: 1 Visit OT Evaluation $OT Eval Moderate Complexity:  1 Mod OT Treatments $Self Care/Home Management : 8-22 mins  Darryl Nestle) Marsa Aris OTR/L Acute Rehabilitation Services Pager: [phone] Office: [place] 07/02/2018, 9:07 AM

## 2018-07-04 LAB — TYPE AND SCREEN
ABO/RH(D): O POS
Antibody Screen: POSITIVE
Unit division: 0
Unit division: 0

## 2018-07-04 LAB — BPAM RBC
Blood Product Expiration Date: 202007132359
Blood Product Expiration Date: 202007132359
Unit Type and Rh: 5100
Unit Type and Rh: 5100

## 2018-07-07 NOTE — Discharge Summary (Signed)
Patient ID: Brittany Ashley MRN: [id number] DOB/AGE: 79-28-1942 79 y.o.  Admit date: 07/01/2018 Discharge date: 07/07/2018  Admission Diagnoses:  Active Problems:   S/P lumbar fusion   Discharge Diagnoses:  Active Problems:   S/P lumbar fusion  status post Procedure(s): Revision Left L4-5 disectomy, posterior spinal fusion interbody L4-5  Past Medical History:  Diagnosis Date  . Benign neoplasm of colon   . Coronary atherosclerosis    coronary atherosclerosis (documented in history at [facility]); mild coronary calcification 03/29/13 chest CT--referred to cardiology by Dr. Bea Graff and had stress test with reportedly PRN f/u  . DDD (degenerative disc disease), lumbar   . Dyspnea    sometimes upon exertion  . GERD (gastroesophageal reflux disease)   . Hyperlipidemia   . Hyperopia with astigmatism and presbyopia, bilateral   . Hypothyroidism   . Nephrolithiasis   . Nontoxic thyroid nodule   . OA (osteoarthritis)   . Osteopenia   . Pneumonia   . Rosacea   . Scoliosis of lumbar spine    lumbar degenerative scoliosis with spinal stenosis  . Vitamin B deficiency   . Vitamin D deficiency   . Wears contact lenses    and glasses    Surgeries: Procedure(s): Revision Left L4-5 disectomy, posterior spinal fusion interbody L4-5 on 07/01/2018   Consultants: None  Discharged Condition: Improved  Hospital Course: Brittany Ashley is an 78 y.o. female who was admitted 07/01/2018 for operative treatment of recurrent disc herniation with radiculopthy. Patient failed conservative treatments (please see the history and physical for the specifics) and had severe unremitting pain that affects sleep, daily activities and work/hobbies. After pre-op clearance, the patient was taken to the operating room on 07/01/2018 and underwent  Procedure(s): Revision Left L4-5 disectomy, posterior spinal fusion interbody L4-5.    Patient was given perioperative antibiotics:  Anti-infectives (From  admission, onward)   Start     Dose/Rate Route Frequency Ordered Stop   07/01/18 1900  ceFAZolin (ANCEF) IVPB 1 g/50 mL premix     1 g 100 mL/hr over 30 Minutes Intravenous Every 8 hours 07/01/18 1631 07/02/18 0345   07/01/18 0921  ceFAZolin (ANCEF) 2-4 GM/100ML-% IVPB    Note to Pharmacy: Brittany Ashley   : cabinet override      07/01/18 0921 07/01/18 2129   07/01/18 0919  ceFAZolin (ANCEF) IVPB 2g/100 mL premix     2 g 200 mL/hr over 30 Minutes Intravenous 30 min pre-op 07/01/18 0919 07/01/18 1117       Patient was given sequential compression devices and early ambulation to prevent DVT.   Patient benefited maximally from hospital stay and there were no complications. At the time of discharge, the patient was urinating/moving their bowels without difficulty, tolerating a regular diet, pain is controlled with oral pain medications and they have been cleared by PT/OT.   Recent vital signs: No data found.   Recent laboratory studies: No results for input(s): WBC, HGB, HCT, PLT, NA, K, CL, CO2, BUN, CREATININE, GLUCOSE, INR, CALCIUM in the last 72 hours.  Invalid input(s): PT, 2   Discharge Medications:   Allergies as of 07/02/2018   No Known Allergies     Medication List    STOP taking these medications   acetaminophen 500 MG tablet Commonly known as: TYLENOL     TAKE these medications   B-12 Compliance Injection 1000 MCG/ML Kit Generic drug: Cyanocobalamin Inject 1,000 mcg as directed every 7 (seven) days.   gabapentin 300 MG capsule Commonly known  as: NEURONTIN Take 300 mg by mouth 2 (two) times daily.   levothyroxine 25 MCG tablet Commonly known as: SYNTHROID Take 25 mcg by mouth daily before breakfast.   omeprazole 20 MG capsule Commonly known as: PRILOSEC Take 20 mg by mouth daily.   ondansetron 4 MG tablet Commonly known as: Zofran Take 1 tablet (4 mg total) by mouth every 8 (eight) hours as needed for nausea or vomiting.   rosuvastatin 5 MG tablet  Commonly known as: CRESTOR Take 5 mg by mouth daily.     ASK your doctor about these medications   oxyCODONE-acetaminophen 10-325 MG tablet Commonly known as: Percocet Take 1 tablet by mouth every 6 (six) hours as needed for up to 5 days for pain. Ask about: Should I take this medication?       Diagnostic Studies: Dg Chest 2 View  Result Date: 06/29/2018 CLINICAL DATA:  Preoperative assessment for lumbar spine surgery EXAM: CHEST - 2 VIEW COMPARISON:  October 28, 2017 FINDINGS: There is no edema or consolidation. The heart size and pulmonary vascularity are normal. No adenopathy. No bone lesions. IMPRESSION: No edema or consolidation. Electronically Signed   By: Lowella Grip III M.D.   On: 06/29/2018 15:18   Dg Lumbar Spine Complete  Result Date: 07/01/2018 CLINICAL DATA:  L4-5 fusion EXAM: DG C-ARM 61-120 MIN; LUMBAR SPINE - COMPLETE 4+ VIEW COMPARISON:  01/27/2018 FINDINGS: Five low resolution intraoperative spot views of the lumbar spine. Total fluoroscopy time was 2 minutes 59 seconds. Prior right lateral surgical plate and fixating screws with interbody device at L3-L4. Intraoperative views demonstrate subsequent placement of posterior rods and fixating screws at L4 and L5. IMPRESSION: Intraoperative fluoroscopic assistance provided during lumbar spine surgery Electronically Signed   By: Donavan Foil M.D.   On: 07/01/2018 14:59   Dg C-arm 1-60 Min  Result Date: 07/01/2018 CLINICAL DATA:  L4-5 fusion EXAM: DG C-ARM 61-120 MIN; LUMBAR SPINE - COMPLETE 4+ VIEW COMPARISON:  01/27/2018 FINDINGS: Five low resolution intraoperative spot views of the lumbar spine. Total fluoroscopy time was 2 minutes 59 seconds. Prior right lateral surgical plate and fixating screws with interbody device at L3-L4. Intraoperative views demonstrate subsequent placement of posterior rods and fixating screws at L4 and L5. IMPRESSION: Intraoperative fluoroscopic assistance provided during lumbar spine surgery  Electronically Signed   By: Donavan Foil M.D.   On: 07/01/2018 14:59    Discharge Instructions    Incentive spirometry RT   Complete by: As directed       Follow-up Information    Melina Schools, MD Follow up.   Specialty: Orthopedic Surgery Contact information: [address] [place] [address] [phone]           Discharge Plan:  discharge to home  Disposition: stable    Signed: Yvonne Kendall Meosha Castanon for [facility] PA-C [facility] [phone] 07/07/2018, 9:52 AM

## 2019-10-15 IMAGING — CR DG SPINE 1V PORT
1 series · 1 of 1 positions shown · non-contrast
Comparison: Single lateral view of the lumbar spine earlier today.

CLINICAL DATA: Intraoperative imaging for patient undergoing lumbar
surgery.

EXAM:
PORTABLE SPINE - 1 VIEW

[lateral]
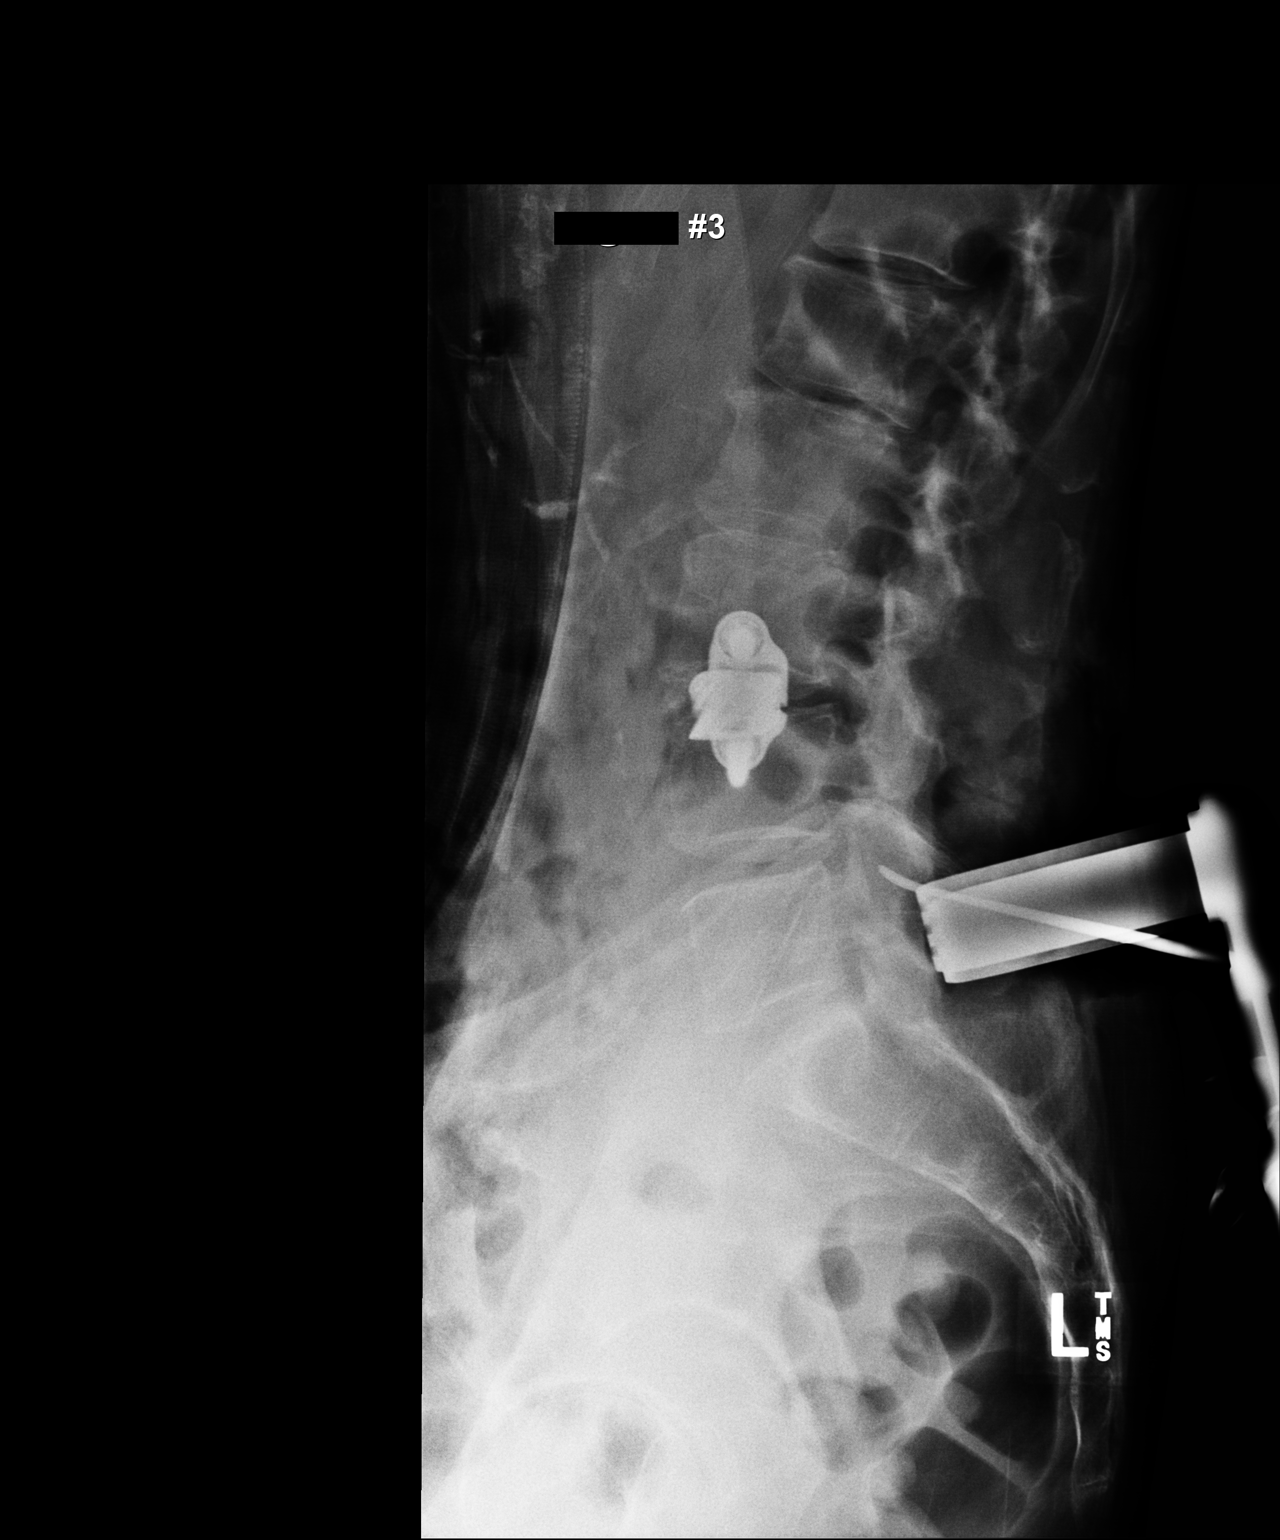

[1 of 1 positions shown; findings below may reference images not displayed]

FINDINGS: Single lateral view of the lumbar spine demonstrates a clamp on the
L5 spinous process. A probe is directed toward the L4-5 level.
IMPRESSION: Localization as above.

## 2020-12-10 ENCOUNTER — Other Ambulatory Visit: Payer: Self-pay | Admitting: Physical Medicine and Rehabilitation

## 2020-12-10 DIAGNOSIS — M5459 Other low back pain: Secondary | ICD-10-CM

## 2020-12-25 ENCOUNTER — Ambulatory Visit
Admission: RE | Admit: 2020-12-25 | Discharge: 2020-12-25 | Disposition: A | Discharge status: Home / Self Care | Payer: Medicare Other | Source: Ambulatory Visit | Attending: Physical Medicine and Rehabilitation | Admitting: Physical Medicine and Rehabilitation

## 2020-12-25 ENCOUNTER — Other Ambulatory Visit: Payer: Self-pay

## 2020-12-25 DIAGNOSIS — M5459 Other low back pain: Secondary | ICD-10-CM

## 2020-12-25 MED ORDER — GADOBENATE DIMEGLUMINE 529 MG/ML IV SOLN
10.0000 mL | Freq: Once | INTRAVENOUS | Status: AC | PRN
  Administered 2020-12-25: 10 mL via INTRAVENOUS

## 2021-10-18 ENCOUNTER — Ambulatory Visit (HOSPITAL_COMMUNITY): Payer: Self-pay | Admitting: Orthopedic Surgery

## 2021-10-29 NOTE — Pre-Procedure Instructions (Signed)
Surgical Instructions    Your procedure is scheduled on Monday, October 16.  Report to [facility] Main Entrance "A" at 8:30 A.M., then check in with the Admitting office.  Call this number if you have problems the morning of surgery:  [phone]   If you have any questions prior to your surgery date call [phone]: Open Monday-Friday 8am-4pm If you experience any cold or flu symptoms such as cough, fever, chills, shortness of breath, etc. between now and your scheduled surgery, please notify us at the above number     Remember:  Do not eat after midnight the night before your surgery  You may drink clear liquids until 7:30AM the morning of your surgery.   Clear liquids allowed are: Water, Non-Citrus Juices (without pulp), Carbonated Beverages, Clear Tea, Black Coffee ONLY (NO MILK, CREAM OR POWDERED CREAMER of any kind), and Gatorade    Take these medicines the morning of surgery with A SIP OF WATER:  levothyroxine (SYNTHROID, LEVOTHROID)  metoprolol succinate (TOPROL-XL) rosuvastatin (CRESTOR)  acetaminophen (TYLENOL) if needed fluticasone (FLONASE) 50 MCG/ACT nasal spray if needed   As of today, STOP taking any Aspirin (unless otherwise instructed by your surgeon) Aleve, Naproxen, Ibuprofen, Motrin, Advil, Goody's, BC's, all herbal medications, fish oil, and all vitamins.  [facility] is not responsible for any belongings or valuables.    Do NOT Smoke (Tobacco/Vaping)  24 hours prior to your procedure  If you use a CPAP at night, you may bring your mask for your overnight stay.   Contacts, glasses, hearing aids, dentures or partials may not be worn into surgery, please bring cases for these belongings   For patients admitted to the hospital, discharge time will be determined by your treatment team.   Patients discharged the day of surgery will not be allowed to drive home, and someone needs to stay with them for 24 hours.   SURGICAL WAITING ROOM VISITATION Patients  having surgery or a procedure may have no more than 2 support people in the waiting area - these visitors may rotate.   Children under the age of 56 must have an adult with them who is not the patient. If the patient needs to stay at the hospital during part of their recovery, the visitor guidelines for inpatient rooms apply. Pre-op nurse will coordinate an appropriate time for 1 support person to accompany patient in pre-op.  This support person may not rotate.   Please refer to the [facility] website for the visitor guidelines for Inpatients (after your surgery is over and you are in a regular room).    Special instructions:    Oral Hygiene is also important to reduce your risk of infection.  Remember - BRUSH YOUR TEETH THE MORNING OF SURGERY WITH YOUR REGULAR TOOTHPASTE   [facility]- Preparing For Surgery  Before surgery, you can play an important role. Because skin is not sterile, your skin needs to be as free of germs as possible. You can reduce the number of germs on your skin by washing with CHG (chlorahexidine gluconate) Soap before surgery.  CHG is an antiseptic cleaner which kills germs and bonds with the skin to continue killing germs even after washing.     Please do not use if you have an allergy to CHG or antibacterial soaps. If your skin becomes reddened/irritated stop using the CHG.  Do not shave (including legs and underarms) for at least 48 hours prior to first CHG shower. It is OK to shave your face.  Please follow these instructions carefully.     Shower the NIGHT BEFORE SURGERY and the MORNING OF SURGERY with CHG Soap.   If you chose to wash your hair, wash your hair first as usual with your normal shampoo. After you shampoo, rinse your hair and body thoroughly to remove the shampoo.  Then [employer] and genitals (private parts) with your normal soap and rinse thoroughly to remove soap.  After that Use CHG Soap as you would any other liquid soap. You can apply CHG  directly to the skin and wash gently with a scrungie or a clean washcloth.   Apply the CHG Soap to your body ONLY FROM THE NECK DOWN.  Do not use on open wounds or open sores. Avoid contact with your eyes, ears, mouth and genitals (private parts). Wash Face and genitals (private parts)  with your normal soap.   Wash thoroughly, paying special attention to the area where your surgery will be performed.  Thoroughly rinse your body with warm water from the neck down.  DO NOT shower/wash with your normal soap after using and rinsing off the CHG Soap.  Pat yourself dry with a CLEAN TOWEL.  Wear CLEAN PAJAMAS to bed the night before surgery  Place CLEAN SHEETS on your bed the night before your surgery  DO NOT SLEEP WITH PETS.   Day of Surgery:  Take a shower with CHG soap. Wear Clean/Comfortable clothing the morning of surgery Do not wear jewelry or makeup. Do not wear lotions, powders, perfumes/cologne or deodorant. Do not shave 48 hours prior to surgery.  Men may shave face and neck. Do not bring valuables to the hospital. Do not wear nail polish, gel polish, artificial nails, or any other type of covering on natural nails (fingers and toes) If you have artificial nails or gel coating that need to be removed by a nail salon, please have this removed prior to surgery. Artificial nails or gel coating may interfere with anesthesia's ability to adequately monitor your vital signs. Remember to brush your teeth WITH YOUR REGULAR TOOTHPASTE.    If you received a COVID test during your pre-op visit, it is requested that you wear a mask when out in public, stay away from anyone that may not be feeling well, and notify your surgeon if you develop symptoms. If you have been in contact with anyone that has tested positive in the last 10 days, please notify your surgeon.    Please read over the following fact sheets that you were given.

## 2021-10-30 ENCOUNTER — Other Ambulatory Visit: Payer: Self-pay

## 2021-10-30 ENCOUNTER — Encounter (HOSPITAL_COMMUNITY)
Admission: RE | Admit: 2021-10-30 | Discharge: 2021-10-30 | Disposition: A | Discharge status: Home / Self Care | Payer: Medicare Other | Source: Ambulatory Visit | Attending: Orthopedic Surgery | Admitting: Orthopedic Surgery

## 2021-10-30 ENCOUNTER — Encounter (HOSPITAL_COMMUNITY): Payer: Self-pay

## 2021-10-30 VITALS — BP 148/81 | HR 57 | Temp 98.2°F | Resp 17 | Ht 63.0 in | Wt 136.3 lb

## 2021-10-30 DIAGNOSIS — Z01812 Encounter for preprocedural laboratory examination: Secondary | ICD-10-CM | POA: Diagnosis not present

## 2021-10-30 DIAGNOSIS — I493 Ventricular premature depolarization: Secondary | ICD-10-CM | POA: Diagnosis not present

## 2021-10-30 DIAGNOSIS — I083 Combined rheumatic disorders of mitral, aortic and tricuspid valves: Secondary | ICD-10-CM | POA: Diagnosis not present

## 2021-10-30 DIAGNOSIS — E785 Hyperlipidemia, unspecified: Secondary | ICD-10-CM | POA: Diagnosis not present

## 2021-10-30 DIAGNOSIS — I517 Cardiomegaly: Secondary | ICD-10-CM | POA: Diagnosis present

## 2021-10-30 DIAGNOSIS — M549 Dorsalgia, unspecified: Secondary | ICD-10-CM | POA: Diagnosis not present

## 2021-10-30 DIAGNOSIS — I10 Essential (primary) hypertension: Secondary | ICD-10-CM | POA: Insufficient documentation

## 2021-10-30 DIAGNOSIS — Z01818 Encounter for other preprocedural examination: Secondary | ICD-10-CM

## 2021-10-30 HISTORY — DX: Essential (primary) hypertension: I10

## 2021-10-30 LAB — SURGICAL PCR SCREEN
MRSA, PCR: NEGATIVE
Staphylococcus aureus: NEGATIVE

## 2021-10-30 NOTE — Progress Notes (Signed)
PCP - Dr.Greg Tomma Lightning- see note in care everywhere from 10/17/2021. Cardiologist - Dr.Thomas Folk-clearance note 10/18/2021 in Care everywhere.   PPM/ICD - denies Device Orders - n/a Rep Notified - n/a  Chest x-ray - n/a EKG - 10/18/2021 Stress Test - 10/25/2020 ECHO - 10/25/2020 Cardiac Cath - denies  Sleep Study - denies CPAP - n/a  Patient denies DM.   Blood Thinner Instructions:denies Aspirin Instructions:n/a  ERAS Protcol -YES  PRE-SURGERY Ensure or G2- none ordered   COVID TEST- n/a  Pt reports + covid test on August 12, 2021, pt denies any cold,flu like symptoms at this time.  Anesthesia review: YES, hx of CAD, follow up on requested EKG tracing. Note in Epic from [place] in 2019.    Patient denies shortness of breath, fever, cough and chest pain at PAT appointment   All instructions explained to the patient, with a verbal understanding of the material. Patient agrees to go over the instructions while at home for a better understanding. Patient also instructed to self quarantine after being tested for COVID-19. The opportunity to ask questions was provided.

## 2021-10-31 NOTE — Progress Notes (Signed)
Anesthesia Chart Review:  Follows with cardiology at [facility] for history of PVCs, mild aortic vegetation, mild mitral regurgitation, HTN, HLD.  Exercise treadmill test November 2022 was negative for ischemia.  Last seen by Dr. Otho Perl 10/18/2021 for preop evaluation.  Per note, "PLAN: Ms. Bergquist seems to be doing well. She denies any significant shortness of breath. Her activity still limited by back pain but she is plans to proceed on with a spinal cord stimulator which hopefully will help improve her symptoms. She has had no further episodes of syncope or near syncope. At this point, she seems to be stable with respect to her PVCs and I will continue to watch this clinically. Her aortic valve sclerosis, aortic regurgitation and mitral regurgitation are stable by her echo last year. I will plan on seeing her back in 6 months and repeat an echocardiogram at that time. At this point, she should be low risk from a cardiac standpoint to proceed on with the spinal cord stimulator surgery."  CMP 10/17/2021 in Care Everywhere reviewed, WNL.  CBC 10/17/2021 in Care Everywhere reviewed, WNL.  EKG 10/18/2021 (copy on chart): Sinus rhythm with PACs.  Rate 65.  Nonspecific ST and T wave abnormality.  Exercise treadmill test 12/05/2020 (Care Everywhere): 1.  Exercise capacity was fair for age with the patient exercised for 2 minutes and 32 seconds of the 2-minute Bruce protocol achieving 5.4 METS.  2.  The stress test was negative for ischemia with no diagnostic EKG changes at target heart rate.   TTE 10/25/2020: SUMMARY  Mild left ventricular hypertrophy  Left ventricular systolic function is normal.  LV ejection fraction = 65-70%.  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  There is no aortic stenosis.  There is mild aortic regurgitation.  There is mild mitral regurgitation.  There is mild tricuspid regurgitation.  There is no comparison study available.      Wynonia Musty [facility] Short Stay  Center/Anesthesiology Phone [phone] 10/31/2021 9:40 AM

## 2021-10-31 NOTE — Anesthesia Preprocedure Evaluation (Addendum)
Anesthesia Evaluation  Patient identified by MRN, date of birth, ID band Patient awake    Reviewed: Allergy & Precautions, NPO status , Patient's Chart, lab work & pertinent test results, reviewed documented beta blocker date and time   Airway Mallampati: II  TM Distance: >3 FB Neck ROM: Full    Dental  (+) Teeth Intact, Dental Advisory Given, Caps,    Pulmonary neg pulmonary ROS,    Pulmonary exam normal breath sounds clear to auscultation       Cardiovascular hypertension, Pt. 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  Reproductive/Obstetrics                          Anesthesia Physical Anesthesia Plan  ASA: 3  Anesthesia Plan: General   Post-op Pain Management: Tylenol PO (pre-op)*   Induction: Intravenous  PONV Risk Score and Plan: 3 and Ondansetron, Dexamethasone and Diphenhydramine  Airway Management Planned: Oral ETT  Additional Equipment:   Intra-op Plan:   Post-operative Plan: Extubation in OR  Informed Consent: I have reviewed the patients History and Physical, chart, labs and discussed the procedure including the risks, benefits and alternatives for the proposed anesthesia with the patient or authorized representative who has indicated his/her understanding and acceptance.     Dental advisory given  Plan Discussed with: Anesthesiologist  Anesthesia Plan Comments: (PAT note by Karoline Caldwell, PA-C: Follows with cardiology at  [facility] for history of PVCs, mild aortic vegetation, mild mitral regurgitation, HTN, HLD.  Exercise treadmill test November 2022 was negative for ischemia.  Last seen by Dr. Otho Perl 10/18/2021 for preop evaluation.  Per note, "PLAN: Ms. Brunette seems to be doing well. She denies any significant shortness of breath. Her activity still limited by back pain but she is plans to proceed on with a spinal cord stimulator which hopefully will help improve her symptoms. She has had no further episodes of syncope or near syncope. At this point, she seems to be stable with respect to her PVCs and I will continue to watch this clinically. Her aortic valve sclerosis, aortic regurgitation and mitral regurgitation are stable by her echo last year. I will plan on seeing her back in 6 months and repeat an echocardiogram at that time. At this point, she should be low risk from a cardiac standpoint to proceed on with the spinal cord stimulator surgery."  CMP 10/17/2021 in Care Everywhere reviewed, WNL.  CBC 10/17/2021 in Care Everywhere reviewed, WNL.  EKG 10/18/2021 (copy on chart): Sinus rhythm with PACs.  Rate 65.  Nonspecific ST and T wave abnormality.  Exercise treadmill test 12/05/2020 (Care Everywhere): 1. Exercise capacity was fair for age with the patient exercised for 2 minutes and 32 seconds of the 2-minute Bruce protocol achieving 5.4 METS.  2. The stress test was negative for ischemia with no diagnostic EKG changes at target heart rate.   TTE 10/25/2020: SUMMARY  Mild left ventricular hypertrophy  Left ventricular systolic function is normal.  LV ejection fraction = 65-70%.  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  There is no aortic stenosis.  There is mild aortic regurgitation.  There is mild mitral regurgitation.  There is mild tricuspid regurgitation.  There is no comparison study available.    )      Anesthesia Quick Evaluation

## 2021-11-04 ENCOUNTER — Other Ambulatory Visit: Payer: Self-pay

## 2021-11-04 ENCOUNTER — Encounter (HOSPITAL_COMMUNITY): Payer: Self-pay | Admitting: Orthopedic Surgery

## 2021-11-04 ENCOUNTER — Encounter (HOSPITAL_COMMUNITY)
Admission: RE | Disposition: A | Discharge status: Home / Self Care | Payer: Self-pay | Source: Ambulatory Visit | Attending: Orthopedic Surgery

## 2021-11-04 ENCOUNTER — Ambulatory Visit (HOSPITAL_COMMUNITY): Payer: Medicare Other | Admitting: Physician Assistant

## 2021-11-04 ENCOUNTER — Ambulatory Visit (HOSPITAL_BASED_OUTPATIENT_CLINIC_OR_DEPARTMENT_OTHER): Payer: Medicare Other | Admitting: Anesthesiology

## 2021-11-04 ENCOUNTER — Observation Stay (HOSPITAL_COMMUNITY)
Admission: RE | Admit: 2021-11-04 | Discharge: 2021-11-05 | Disposition: A | Discharge status: Home / Self Care | Payer: Medicare Other | Source: Ambulatory Visit | Attending: Orthopedic Surgery | Admitting: Orthopedic Surgery

## 2021-11-04 ENCOUNTER — Ambulatory Visit (HOSPITAL_COMMUNITY): Payer: Medicare Other

## 2021-11-04 DIAGNOSIS — G8929 Other chronic pain: Secondary | ICD-10-CM | POA: Diagnosis not present

## 2021-11-04 DIAGNOSIS — M961 Postlaminectomy syndrome, not elsewhere classified: Secondary | ICD-10-CM

## 2021-11-04 DIAGNOSIS — I10 Essential (primary) hypertension: Secondary | ICD-10-CM | POA: Insufficient documentation

## 2021-11-04 DIAGNOSIS — I2510 Atherosclerotic heart disease of native coronary artery without angina pectoris: Secondary | ICD-10-CM | POA: Insufficient documentation

## 2021-11-04 DIAGNOSIS — G894 Chronic pain syndrome: Secondary | ICD-10-CM

## 2021-11-04 DIAGNOSIS — E039 Hypothyroidism, unspecified: Secondary | ICD-10-CM | POA: Insufficient documentation

## 2021-11-04 DIAGNOSIS — M5459 Other low back pain: Secondary | ICD-10-CM | POA: Diagnosis not present

## 2021-11-04 DIAGNOSIS — Z79899 Other long term (current) drug therapy: Secondary | ICD-10-CM | POA: Diagnosis not present

## 2021-11-04 HISTORY — PX: SPINAL CORD STIMULATOR INSERTION: SHX5378

## 2021-11-04 SURGERY — INSERTION, SPINAL CORD STIMULATOR, LUMBAR
Anesthesia: General | Site: Spine Thoracic

## 2021-11-04 MED ORDER — ONDANSETRON HCL 4 MG PO TABS: 4.0000 mg | ORAL_TABLET | Freq: Three times a day (TID) | ORAL | 0 refills | Status: AC | PRN

## 2021-11-04 MED ORDER — METHOCARBAMOL 500 MG PO TABS: 500.0000 mg | ORAL_TABLET | Freq: Three times a day (TID) | ORAL | 0 refills | Status: AC | PRN

## 2021-11-04 MED ORDER — FENTANYL CITRATE (PF) 100 MCG/2ML IJ SOLN: 25.0000 ug | INTRAMUSCULAR | Status: DC | PRN

## 2021-11-04 MED ORDER — PHENYLEPHRINE 80 MCG/ML (10ML) SYRINGE FOR IV PUSH (FOR BLOOD PRESSURE SUPPORT)
PREFILLED_SYRINGE | INTRAVENOUS | Status: AC
  Filled 2021-11-04: qty 10

## 2021-11-04 MED ORDER — PROPOFOL 10 MG/ML IV BOLUS
INTRAVENOUS | Status: DC | PRN
  Administered 2021-11-04: 20 mg via INTRAVENOUS
  Administered 2021-11-04: 100 mg via INTRAVENOUS

## 2021-11-04 MED ORDER — LIDOCAINE 2% (20 MG/ML) 5 ML SYRINGE
INTRAMUSCULAR | Status: DC | PRN
  Administered 2021-11-04: 60 mg via INTRAVENOUS

## 2021-11-04 MED ORDER — 0.9 % SODIUM CHLORIDE (POUR BTL) OPTIME
TOPICAL | Status: DC | PRN
  Administered 2021-11-04 (×2): 1000 mL

## 2021-11-04 MED ORDER — BUPIVACAINE-EPINEPHRINE 0.25% -1:200000 IJ SOLN
INTRAMUSCULAR | Status: DC | PRN
  Administered 2021-11-04: 10 mL

## 2021-11-04 MED ORDER — LACTATED RINGERS IV SOLN: INTRAVENOUS | Status: DC

## 2021-11-04 MED ORDER — FENTANYL CITRATE (PF) 250 MCG/5ML IJ SOLN
INTRAMUSCULAR | Status: DC | PRN
  Administered 2021-11-04: 50 ug via INTRAVENOUS
  Administered 2021-11-04: 100 ug via INTRAVENOUS

## 2021-11-04 MED ORDER — PROPOFOL 10 MG/ML IV BOLUS
INTRAVENOUS | Status: AC
  Filled 2021-11-04: qty 20

## 2021-11-04 MED ORDER — EPHEDRINE 5 MG/ML INJ
INTRAVENOUS | Status: AC
  Filled 2021-11-04: qty 5

## 2021-11-04 MED ORDER — ONDANSETRON HCL 4 MG/2ML IJ SOLN
INTRAMUSCULAR | Status: DC | PRN
  Administered 2021-11-04: 4 mg via INTRAVENOUS

## 2021-11-04 MED ORDER — ROCURONIUM BROMIDE 10 MG/ML (PF) SYRINGE
PREFILLED_SYRINGE | INTRAVENOUS | Status: AC
  Filled 2021-11-04: qty 10

## 2021-11-04 MED ORDER — PHENYLEPHRINE 80 MCG/ML (10ML) SYRINGE FOR IV PUSH (FOR BLOOD PRESSURE SUPPORT)
PREFILLED_SYRINGE | INTRAVENOUS | Status: DC | PRN
  Administered 2021-11-04 (×3): 80 ug via INTRAVENOUS

## 2021-11-04 MED ORDER — METOPROLOL SUCCINATE ER 25 MG PO TB24
25.0000 mg | ORAL_TABLET | Freq: Every day | ORAL | Status: DC
  Administered 2021-11-05: 25 mg via ORAL
  Filled 2021-11-04: qty 1

## 2021-11-04 MED ORDER — ROSUVASTATIN CALCIUM 5 MG PO TABS
5.0000 mg | ORAL_TABLET | Freq: Every day | ORAL | Status: DC
  Administered 2021-11-05: 5 mg via ORAL
  Filled 2021-11-04: qty 1

## 2021-11-04 MED ORDER — DEXAMETHASONE SODIUM PHOSPHATE 10 MG/ML IJ SOLN
INTRAMUSCULAR | Status: DC | PRN
  Administered 2021-11-04: 10 mg via INTRAVENOUS

## 2021-11-04 MED ORDER — ROCURONIUM BROMIDE 10 MG/ML (PF) SYRINGE
PREFILLED_SYRINGE | INTRAVENOUS | Status: DC | PRN
  Administered 2021-11-04: 40 mg via INTRAVENOUS
  Administered 2021-11-04: 20 mg via INTRAVENOUS

## 2021-11-04 MED ORDER — SODIUM CHLORIDE 0.9% FLUSH: 3.0000 mL | INTRAVENOUS | Status: DC | PRN

## 2021-11-04 MED ORDER — ALENDRONATE SODIUM 70 MG PO TABS: 70.0000 mg | ORAL_TABLET | ORAL | Status: DC

## 2021-11-04 MED ORDER — POLYETHYLENE GLYCOL 3350 17 G PO PACK
17.0000 g | PACK | Freq: Every day | ORAL | Status: DC | PRN
  Administered 2021-11-04: 17 g via ORAL
  Filled 2021-11-04: qty 1

## 2021-11-04 MED ORDER — CHLORHEXIDINE GLUCONATE 0.12 % MT SOLN
15.0000 mL | Freq: Once | OROMUCOSAL | Status: AC
  Administered 2021-11-04: 15 mL via OROMUCOSAL
  Filled 2021-11-04: qty 15

## 2021-11-04 MED ORDER — LEVOTHYROXINE SODIUM 25 MCG PO TABS
25.0000 ug | ORAL_TABLET | Freq: Every day | ORAL | Status: DC
  Administered 2021-11-05: 25 ug via ORAL
  Filled 2021-11-04: qty 1

## 2021-11-04 MED ORDER — OXYCODONE-ACETAMINOPHEN 10-325 MG PO TABS: 1.0000 | ORAL_TABLET | Freq: Four times a day (QID) | ORAL | 0 refills | Status: AC | PRN

## 2021-11-04 MED ORDER — AMISULPRIDE (ANTIEMETIC) 5 MG/2ML IV SOLN: 10.0000 mg | Freq: Once | INTRAVENOUS | Status: DC | PRN

## 2021-11-04 MED ORDER — PHENOL 1.4 % MT LIQD: 1.0000 | OROMUCOSAL | Status: DC | PRN

## 2021-11-04 MED ORDER — SODIUM CHLORIDE 0.9% FLUSH
3.0000 mL | Freq: Two times a day (BID) | INTRAVENOUS | Status: DC
  Administered 2021-11-04 (×2): 3 mL via INTRAVENOUS

## 2021-11-04 MED ORDER — BUPIVACAINE LIPOSOME 1.3 % IJ SUSP
INTRAMUSCULAR | Status: AC
  Filled 2021-11-04: qty 20

## 2021-11-04 MED ORDER — BUPIVACAINE-EPINEPHRINE (PF) 0.25% -1:200000 IJ SOLN
INTRAMUSCULAR | Status: AC
  Filled 2021-11-04: qty 30

## 2021-11-04 MED ORDER — METHOCARBAMOL 500 MG PO TABS
500.0000 mg | ORAL_TABLET | Freq: Four times a day (QID) | ORAL | Status: DC | PRN
  Administered 2021-11-04 – 2021-11-05 (×2): 500 mg via ORAL
  Filled 2021-11-04 (×2): qty 1

## 2021-11-04 MED ORDER — ONDANSETRON HCL 4 MG/2ML IJ SOLN
INTRAMUSCULAR | Status: AC
  Filled 2021-11-04: qty 2

## 2021-11-04 MED ORDER — ONDANSETRON HCL 4 MG/2ML IJ SOLN
4.0000 mg | Freq: Four times a day (QID) | INTRAMUSCULAR | Status: DC | PRN
  Administered 2021-11-05: 4 mg via INTRAVENOUS
  Filled 2021-11-04: qty 2

## 2021-11-04 MED ORDER — ACETAMINOPHEN 500 MG PO TABS
1000.0000 mg | ORAL_TABLET | Freq: Once | ORAL | Status: AC
  Administered 2021-11-04: 1000 mg via ORAL
  Filled 2021-11-04: qty 2

## 2021-11-04 MED ORDER — HYDROMORPHONE HCL 1 MG/ML IJ SOLN
0.5000 mg | INTRAMUSCULAR | Status: DC | PRN
  Administered 2021-11-04: 0.5 mg via INTRAVENOUS
  Filled 2021-11-04: qty 0.5

## 2021-11-04 MED ORDER — ACETAMINOPHEN 650 MG RE SUPP: 650.0000 mg | RECTAL | Status: DC | PRN

## 2021-11-04 MED ORDER — SUGAMMADEX SODIUM 200 MG/2ML IV SOLN
INTRAVENOUS | Status: DC | PRN
  Administered 2021-11-04: 200 mg via INTRAVENOUS

## 2021-11-04 MED ORDER — TRANEXAMIC ACID-NACL 1000-0.7 MG/100ML-% IV SOLN
1000.0000 mg | INTRAVENOUS | Status: AC
  Administered 2021-11-04: 1000 mg via INTRAVENOUS
  Filled 2021-11-04: qty 100

## 2021-11-04 MED ORDER — SURGIFLO WITH THROMBIN (HEMOSTATIC MATRIX KIT) OPTIME
TOPICAL | Status: DC | PRN
  Administered 2021-11-04: 1 via TOPICAL

## 2021-11-04 MED ORDER — PROMETHAZINE HCL 25 MG/ML IJ SOLN: 6.2500 mg | INTRAMUSCULAR | Status: DC | PRN

## 2021-11-04 MED ORDER — OXYCODONE HCL 5 MG PO TABS
10.0000 mg | ORAL_TABLET | ORAL | Status: DC | PRN
  Administered 2021-11-04: 10 mg via ORAL
  Filled 2021-11-04: qty 2

## 2021-11-04 MED ORDER — CEFAZOLIN SODIUM-DEXTROSE 2-4 GM/100ML-% IV SOLN
2.0000 g | INTRAVENOUS | Status: AC
  Administered 2021-11-04: 2 g via INTRAVENOUS
  Filled 2021-11-04: qty 100

## 2021-11-04 MED ORDER — ATROPINE SULFATE 0.4 MG/ML IV SOLN
INTRAVENOUS | Status: AC
  Filled 2021-11-04: qty 1

## 2021-11-04 MED ORDER — ORAL CARE MOUTH RINSE: 15.0000 mL | Freq: Once | OROMUCOSAL | Status: AC

## 2021-11-04 MED ORDER — THROMBIN 20000 UNITS EX SOLR
CUTANEOUS | Status: AC
  Filled 2021-11-04: qty 20000

## 2021-11-04 MED ORDER — LIDOCAINE 2% (20 MG/ML) 5 ML SYRINGE
INTRAMUSCULAR | Status: AC
  Filled 2021-11-04: qty 5

## 2021-11-04 MED ORDER — BUPIVACAINE-EPINEPHRINE 0.25% -1:200000 IJ SOLN
INTRAMUSCULAR | Status: DC | PRN
  Administered 2021-11-04: 40 mL via INTRAMUSCULAR

## 2021-11-04 MED ORDER — SODIUM CHLORIDE 0.9 % IV SOLN: INTRAVENOUS | Status: DC

## 2021-11-04 MED ORDER — FLUTICASONE PROPIONATE 50 MCG/ACT NA SUSP
1.0000 | Freq: Every day | NASAL | Status: DC
  Filled 2021-11-04: qty 16

## 2021-11-04 MED ORDER — METHOCARBAMOL 1000 MG/10ML IJ SOLN: 500.0000 mg | Freq: Four times a day (QID) | INTRAVENOUS | Status: DC | PRN

## 2021-11-04 MED ORDER — MENTHOL 3 MG MT LOZG: 1.0000 | LOZENGE | OROMUCOSAL | Status: DC | PRN

## 2021-11-04 MED ORDER — ACETAMINOPHEN 325 MG PO TABS
650.0000 mg | ORAL_TABLET | ORAL | Status: DC | PRN
  Administered 2021-11-04 – 2021-11-05 (×4): 650 mg via ORAL
  Filled 2021-11-04 (×4): qty 2

## 2021-11-04 MED ORDER — ONDANSETRON HCL 4 MG PO TABS
4.0000 mg | ORAL_TABLET | Freq: Four times a day (QID) | ORAL | Status: DC | PRN
  Administered 2021-11-05: 4 mg via ORAL
  Filled 2021-11-04: qty 1

## 2021-11-04 MED ORDER — OXYCODONE HCL 5 MG PO TABS
5.0000 mg | ORAL_TABLET | ORAL | Status: DC | PRN
  Administered 2021-11-05: 5 mg via ORAL
  Filled 2021-11-04: qty 1

## 2021-11-04 MED ORDER — CEFAZOLIN SODIUM-DEXTROSE 1-4 GM/50ML-% IV SOLN
1.0000 g | Freq: Three times a day (TID) | INTRAVENOUS | Status: AC
  Administered 2021-11-04 (×2): 1 g via INTRAVENOUS
  Filled 2021-11-04 (×2): qty 50

## 2021-11-04 MED ORDER — PHENYLEPHRINE HCL-NACL 20-0.9 MG/250ML-% IV SOLN
INTRAVENOUS | Status: DC | PRN
  Administered 2021-11-04: 30 ug/min via INTRAVENOUS

## 2021-11-04 MED ORDER — FENTANYL CITRATE (PF) 250 MCG/5ML IJ SOLN
INTRAMUSCULAR | Status: AC
  Filled 2021-11-04: qty 5

## 2021-11-04 MED ORDER — FLEET ENEMA 7-19 GM/118ML RE ENEM: 1.0000 | ENEMA | Freq: Once | RECTAL | Status: DC | PRN

## 2021-11-04 SURGICAL SUPPLY — 56 items
ANCHOR SWIFT LOCK IMPLANT
BAG COUNTER SPONGE SURGICOUNT (BAG) IMPLANT
CANISTER SUCT 3000ML PPV (MISCELLANEOUS) ×1 IMPLANT
CLSR STERI-STRIP ANTIMIC 1/2X4 (GAUZE/BANDAGES/DRESSINGS) ×1 IMPLANT
CONTROLLER MAGNET PT W/MANUAL (MISCELLANEOUS) IMPLANT
CONTROLLER NEUROSTIM PATIENT (NEUROSURGERY SUPPLIES) IMPLANT
COVER MAYO STAND STRL (DRAPES) ×1 IMPLANT
COVER PROBE W GEL 5X96 (DRAPES) IMPLANT
COVER SURGICAL LIGHT HANDLE (MISCELLANEOUS) ×1 IMPLANT
DRAPE C-ARM 42X72 X-RAY (DRAPES) ×1 IMPLANT
DRAPE SURG 17X23 STRL (DRAPES) ×1 IMPLANT
DRAPE U-SHAPE 47X51 STRL (DRAPES) ×1 IMPLANT
DRSG OPSITE POSTOP 4X6 (GAUZE/BANDAGES/DRESSINGS) ×1 IMPLANT
DURAPREP 26ML APPLICATOR (WOUND CARE) ×1 IMPLANT
ELECT BLADE 4.0 EZ CLEAN MEGAD (MISCELLANEOUS)
ELECT CAUTERY BLADE 6.4 (BLADE) IMPLANT
ELECT PENCIL ROCKER SW 15FT (MISCELLANEOUS) ×1 IMPLANT
ELECT REM PT RETURN 9FT ADLT (ELECTROSURGICAL) ×1
ELECTRODE BLDE 4.0 EZ CLN MEGD (MISCELLANEOUS) IMPLANT
ELECTRODE REM PT RTRN 9FT ADLT (ELECTROSURGICAL) ×1 IMPLANT
GENERATOR PROCLAIM PLUS 5 (Neuro Prosthesis/Implant) IMPLANT
GLOVE BIO SURGEON STRL SZ7 (GLOVE) ×1 IMPLANT
GLOVE BIOGEL PI IND STRL 7.0 (GLOVE) ×1 IMPLANT
GLOVE BIOGEL PI IND STRL 8.5 (GLOVE) ×1 IMPLANT
GLOVE SS N UNI LF 8.5 STRL (GLOVE) ×1 IMPLANT
GOWN STRL REUS W/ TWL LRG LVL3 (GOWN DISPOSABLE) ×2 IMPLANT
GOWN STRL REUS W/TWL 2XL LVL3 (GOWN DISPOSABLE) ×1 IMPLANT
GOWN STRL REUS W/TWL LRG LVL3 (GOWN DISPOSABLE) ×2
KIT BASIN OR (CUSTOM PROCEDURE TRAY) ×1 IMPLANT
KIT TURNOVER KIT B (KITS) ×1 IMPLANT
LEAD OCTRODE GEN 8CH 60CM (Lead) IMPLANT
NDL 22X1.5 STRL (OR ONLY) (MISCELLANEOUS) ×1 IMPLANT
NDL SPNL 18GX3.5 QUINCKE PK (NEEDLE) ×1 IMPLANT
NEEDLE 22X1.5 STRL (OR ONLY) (MISCELLANEOUS) ×1 IMPLANT
NEEDLE SPNL 18GX3.5 QUINCKE PK (NEEDLE) ×1 IMPLANT
NS IRRIG 1000ML POUR BTL (IV SOLUTION) ×1 IMPLANT
PACK LAMINECTOMY ORTHO (CUSTOM PROCEDURE TRAY) ×1 IMPLANT
PACK UNIVERSAL I (CUSTOM PROCEDURE TRAY) ×1 IMPLANT
PAD ARMBOARD 7.5X6 YLW CONV (MISCELLANEOUS) ×3 IMPLANT
SPATULA SILICONE BRAIN 10MM (MISCELLANEOUS) IMPLANT
SPONGE SURGIFOAM ABS GEL 100 (HEMOSTASIS) ×1 IMPLANT
SPONGE T-LAP 4X18 ~~LOC~~+RFID (SPONGE) IMPLANT
STAPLER VISISTAT 35W (STAPLE) IMPLANT
SURGIFLO W/THROMBIN 8M KIT (HEMOSTASIS) ×1 IMPLANT
SUT BONE WAX W31G (SUTURE) ×1 IMPLANT
SUT ETHIBOND 2 OS 4 DA (SUTURE) ×1 IMPLANT
SUT MNCRL AB 3-0 PS2 18 (SUTURE) ×2 IMPLANT
SUT VIC AB 1 CT1 18XCR BRD 8 (SUTURE) ×2 IMPLANT
SUT VIC AB 1 CT1 8-18 (SUTURE) ×2
SUT VIC AB 2-0 CT1 18 (SUTURE) ×1 IMPLANT
SYR BULB IRRIG 60ML STRL (SYRINGE) ×1 IMPLANT
SYR CONTROL 10ML LL (SYRINGE) ×1 IMPLANT
TOWEL GREEN STERILE (TOWEL DISPOSABLE) ×1 IMPLANT
TOWEL GREEN STERILE FF (TOWEL DISPOSABLE) ×1 IMPLANT
WATER STERILE IRR 1000ML POUR (IV SOLUTION) ×1 IMPLANT
YANKAUER SUCT BULB TIP NO VENT (SUCTIONS) ×1 IMPLANT

## 2021-11-04 NOTE — Op Note (Signed)
OPERATIVE REPORT  DATE OF SURGERY: 11/04/2021  PATIENT NAME:  Brittany Ashley MRN: [id number] DOB: [birth year]  PCP: Raina Mina., MD  PRE-OPERATIVE DIAGNOSIS: Chronic pain syndrome, failed back syndrome.  S/P successful spinal cord stimulator trial  POST-OPERATIVE DIAGNOSIS: Same  PROCEDURE:   Permanent implantation of spinal cord stimulator  SURGEON:  Melina Schools, MD  PHYSICIAN ASSISTANT: None  ANESTHESIA:   General  EBL: 50 ml   Complications: None  Implants: Abbott spine:Octrode lead x 2.  Proclaim Plus 5 pulse generator  BRIEF HISTORY: Brittany Ashley is a 81 y.o. female who has had prior lumbar fusion surgery and unfortunately continues to have significant pain.  She recently had a spinal cord stimulator trial and had significant improvement in her pain and quality of life.  As a result of the successful trial we elected to move forward with permanent implantation.  All appropriate risks, benefits, and alternatives to surgery were discussed and consent was obtained.  PROCEDURE DETAILS: Patient was brought into the operating room and was properly positioned on the operating room table.  After induction with general   Anesthesia the patient was endotracheally intubated.  A timeout was taken to confirm all important data: including patient, procedure, and the level. Teds, SCD's were applied.   the patient was turned prone onto the Wilson frame and all bony prominences were well-padded.  The back was then prepped and draped in a standard fashion.  Using fluoroscopy identified the T10 and T12 pedicles and marked out the midline incision.  I infiltrated the skin with quarter percent Marcaine with epinephrine and then made an incision.  Sharp dissection was carried out down to the deep fascia.  I incised the deep fascia and stripped the paraspinal muscles bilaterally to expose the T11-T12 spinous process and the inferior third of the T10 spinous process.  Another x-ray was taken to  confirm the T10-11 interspinous process space.  Once I confirm this I began my posterior laminotomy of T10.  I remove the inferior third of the spinous process of T10 and then used a 2 mm Kerrison rongeur to perform a laminotomy of T10.  I dissected through the central raphae and then resected the ligamentum flavum with a Kerrison rongeur to expose the dorsal surface of the thecal sac.  Once this was done I then gently passed the octreotide lead just to the left of midline to the level of the T7-8 disc space.  I confirmed with the representative that it was properly positioned and could adequately cover an area similar to what was done during the trial.  A second probe lead was placed just to the right of midline and again came to rest at the T7-8 disc space level.  Both leads were easily passed and there was no resistance.  Imaging in both the AP and lateral planes demonstrated satisfactory position confirmed by the representative.  I then secured the leads directly to the spinous process of T11 with an Ethibond suture.  I used the self locking soft tissue protector for the octreotide lead.  Once it was secured directly to bone I then passed it through the T11-12 interspinous process space.  I irrigated this wound copiously with normal saline and made sure that hemostasis using bipolar cautery and Floseal.  A second incision was made over the gluteal region and I sharply dissected down approximately 2 and half centimeters.  I created a pocket and then passed the leads from the thoracic wound down to the gluteal wound.  I then used the leads into the battery and tightened the locking knot according manufacture standards.  Both wounds were now copiously irrigated with normal saline.  The paraspinal muscles of the thoracic wound were then infiltrated with quarter percent Marcaine with Exparel for postoperative analgesia.  I also infiltrated the pocket with quarter percent Marcaine with Exparel for analgesia.  The  battery was then placed into the pocket and secured to the deep fascia with two #1 Vicryl sutures.  Once hemostasis was again confirmed and final irrigation was done I closed the deep fascia of both wounds with interrupted #1 Vicryl sutures.  I then followed this with a layer 2-0 Vicryl suture, followed by 3-0 Monocryl for the skin.  Steri-Strips and a dry dressing were applied and the patient was ultimately extubated and transferred to PACU without incident.  The end of the case all needle sponge counts were correct.  There were no adverse intraoperative events.  Melina Schools, MD 11/04/2021 12:12 PM

## 2021-11-04 NOTE — Transfer of Care (Signed)
Immediate Anesthesia Transfer of Care Note  Patient: Brittany Ashley  Procedure(s) Performed: SPINAL CORD STIMULATOR PLACEMENT (Spine Thoracic)  Patient Location: PACU  Anesthesia Type:General  Level of Consciousness: awake and alert   Airway & Oxygen Therapy: Patient Spontanous Breathing  Post-op Assessment: Report given to RN and Post -op Vital signs reviewed and stable  Post vital signs: Reviewed and stable  Last Vitals:  Vitals Value Taken Time  BP 154/75 11/04/21 1218  Temp    Pulse 74 11/04/21 1219  Resp 16 11/04/21 1219  SpO2 100 % 11/04/21 1219  Vitals shown include unvalidated device data.  Last Pain:  Vitals:   11/04/21 0850  TempSrc: Oral  PainSc:          Complications: No notable events documented.

## 2021-11-04 NOTE — H&P (Signed)
History: Brittany Ashley is an 81 year old woman with chronic back pain status post previous lumbar spinal fusion surgery. Patient had a successful spinal cord stimulator trial and so presents today for permanent implantation.  Past Medical History:  Diagnosis Date   Benign neoplasm of colon    Coronary atherosclerosis    coronary atherosclerosis (documented in history at [facility]); mild coronary calcification 03/29/13 chest CT--referred to cardiology by Dr. Bea Graff and had stress test with reportedly PRN f/u   DDD (degenerative disc disease), lumbar    Dyspnea    sometimes upon exertion   GERD (gastroesophageal reflux disease)    Hyperlipidemia    Hyperopia with astigmatism and presbyopia, bilateral    Hypertension    Hypothyroidism    Nephrolithiasis    Nontoxic thyroid nodule    OA (osteoarthritis)    Osteopenia    Pneumonia    Rosacea    Scoliosis of lumbar spine    lumbar degenerative scoliosis with spinal stenosis   Vitamin B deficiency    Vitamin D deficiency    Wears contact lenses    and glasses    No Known Allergies  No current facility-administered medications on file prior to encounter.   Current Outpatient Medications on File Prior to Encounter  Medication Sig Dispense Refill   acetaminophen (TYLENOL) 500 MG tablet Take 1,000 mg by mouth every 6 (six) hours as needed for moderate pain.     alendronate (FOSAMAX) 70 MG tablet Take 70 mg by mouth once a week.     cyanocobalamin (VITAMIN B12) 1000 MCG/ML injection Inject 1,000 mcg as directed every 30 (thirty) days.     fluticasone (FLONASE) 50 MCG/ACT nasal spray Place 1 spray into both nostrils daily.     levothyroxine (SYNTHROID, LEVOTHROID) 25 MCG tablet Take 25 mcg by mouth daily before breakfast.      metoprolol succinate (TOPROL-XL) 25 MG 24 hr tablet Take 25 mg by mouth daily.     rosuvastatin (CRESTOR) 5 MG tablet Take 5 mg by mouth daily.     saline (AYR) GEL Place 1 Application into both nostrils  every 6 (six) hours as needed (dry/irritated nostrils).     ondansetron (ZOFRAN) 4 MG tablet Take 1 tablet (4 mg total) by mouth every 8 (eight) hours as needed for nausea or vomiting. (Patient not taking: Reported on 10/25/2021) 20 tablet 0    Physical Exam: Clinical exam: Brittany Ashley is a pleasant individual, who appears younger than their stated age.  She is alert and orientated 3.  No shortness of breath, chest pain.  Abdomen is soft and non-tender, negative loss of bowel and bladder control, no rebound tenderness.  Negative: skin lesions abrasions contusions  Peripheral pulses: 2+ peripheral pulses bilaterally. LE compartments are: Soft and nontender.  Gait pattern: Altered gait pattern due to low back buttock and bilateral lower extremity dysesthesias.  Assistive devices: Cane  Neuro: Positive numbness and dysesthesias into the lower extremity bilaterally. Negative Babinski test, no clonus, negative straight leg raise test. 5/5 motor strength in the lower extremity bilaterally.  Musculoskeletal: Obvious degenerative scoliosis of the lumbar spine is noted. Well-healed surgical scar from prior L3-5 instrumented fusion. Significant midline thoracolumbar pain with palpation and range of motion. Pain radiates into the paraspinal region.  Imaging: X-rays of the lumbar spine demonstrate residual degenerative scoliosis of the thoracolumbar spine. Solid L3-5 fusion with no hardware complications.  Thoracic MRI: completed on 10/09/2021: Mild midline central stenosis T10/11 and L1-2. No cord signal changes. No cyst  or mass in the soft tissues. No contraindications for spinal cord stimulator placement.  A/P: Summary: Brittany Ashley is a very pleasant 81 year old woman who has had 2 prior lumbar fusions and unfortunately still has significant pain and loss in quality of life. She recently underwent a spinal cord stimulator trial and had significant improvement in her pain and quality of life. As result she  presents today to move forward with permanent implantation. I have gone over the surgical procedure with the patient and her son-in-law. All of their questions were encouraged and addressed. We will plan on definitive spinal cord stimulator implantation after we obtain preoperative medical clearance from her primary care provider. Risks and benefits of surgery were discussed with the patient. These include: Infection, bleeding, death, stroke, paralysis, ongoing or worse pain, need for additional surgery, leak of spinal fluid, Failure of the battery requiring reoperation. Inability to place the paddle requiring the surgery to be aborted. Migration of the lead, failure to obtain results similar to the trial.

## 2021-11-04 NOTE — Anesthesia Postprocedure Evaluation (Signed)
Anesthesia Post Note  Patient: Brittany Ashley  Procedure(s) Performed: SPINAL CORD STIMULATOR PLACEMENT (Spine Thoracic)     Patient location during evaluation: PACU Anesthesia Type: General Level of consciousness: awake and alert Pain management: pain level controlled Vital Signs Assessment: post-procedure vital signs reviewed and stable Respiratory status: spontaneous breathing, nonlabored ventilation, respiratory function stable and patient connected to nasal cannula oxygen Cardiovascular status: blood pressure returned to baseline and stable Postop Assessment: no apparent nausea or vomiting Anesthetic complications: no   No notable events documented.  Last Vitals:  Vitals:   11/04/21 1245 11/04/21 1312  BP: (!) 141/70 (!) 168/78  Pulse: 66 60  Resp: 18 18  Temp:  (!) 36.4 C  SpO2: 100% 99%    Last Pain:  Vitals:   11/04/21 1701  TempSrc:   PainSc: [name]

## 2021-11-04 NOTE — Discharge Instructions (Signed)

## 2021-11-04 NOTE — Brief Op Note (Signed)
11/04/2021  12:21 PM  PATIENT:  William Dalton  81 y.o. 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

## 2021-11-05 ENCOUNTER — Encounter (HOSPITAL_COMMUNITY): Payer: Self-pay | Admitting: Orthopedic Surgery

## 2021-11-05 DIAGNOSIS — M961 Postlaminectomy syndrome, not elsewhere classified: Secondary | ICD-10-CM | POA: Diagnosis not present

## 2021-11-05 NOTE — Discharge Summary (Signed)
Patient ID: Brittany Ashley MRN: [id number] DOB/AGE: [birth year] 81 y.o.  Admit date: 11/04/2021 Discharge date: 11/05/2021  Admission Diagnoses:  Principal Problem:   Chronic pain   Discharge Diagnoses:  Principal Problem:   Chronic pain  status post Procedure(s): SPINAL CORD STIMULATOR PLACEMENT  Past Medical History:  Diagnosis Date   Benign neoplasm of colon    Coronary atherosclerosis    coronary atherosclerosis (documented in history at [facility]); mild coronary calcification 03/29/13 chest CT--referred to cardiology by Dr. Bea Graff and had stress test with reportedly PRN f/u   DDD (degenerative disc disease), lumbar    Dyspnea    sometimes upon exertion   GERD (gastroesophageal reflux disease)    Hyperlipidemia    Hyperopia with astigmatism and presbyopia, bilateral    Hypertension    Hypothyroidism    Nephrolithiasis    Nontoxic thyroid nodule    OA (osteoarthritis)    Osteopenia    Pneumonia    Rosacea    Scoliosis of lumbar spine    lumbar degenerative scoliosis with spinal stenosis   Vitamin B deficiency    Vitamin D deficiency    Wears contact lenses    and glasses    Surgeries: Procedure(s): SPINAL CORD STIMULATOR PLACEMENT on 11/04/2021   Consultants:   Discharged Condition: Improved  Hospital Course: Yassmin Binegar is an 81 y.o. female who was admitted 11/04/2021 for operative treatment of Chronic pain. Patient failed conservative treatments (please see the history and physical for the specifics) and had severe unremitting pain that affects sleep, daily activities and work/hobbies. After pre-op clearance, the patient was taken to the operating room on 11/04/2021 and underwent  Procedure(s): [place].    Patient was given perioperative antibiotics:  Anti-infectives (From admission, onward)    Start     Dose/Rate Route Frequency Ordered Stop   11/04/21 1300  ceFAZolin (ANCEF) IVPB 1 g/50 mL premix        1  g 100 mL/hr over 30 Minutes Intravenous Every 8 hours 11/04/21 1257 11/04/21 2049   11/04/21 0818  ceFAZolin (ANCEF) IVPB 2g/100 mL premix        2 g 200 mL/hr over 30 Minutes Intravenous 30 min pre-op 11/04/21 0818 11/04/21 1028        Patient was given sequential compression devices and early ambulation to prevent DVT.   Patient benefited maximally from hospital stay and there were no complications. At the time of discharge, the patient was urinating/moving their bowels without difficulty, tolerating a regular diet, pain is controlled with oral pain medications and they have been cleared by PT/OT.   Recent vital signs: Patient Vitals for the past 24 hrs:  BP Temp Temp src Pulse Resp SpO2 Height Weight  11/05/21 0732 120/62 98.5 F (36.9 C) Oral 65 16 98 % -- --  11/05/21 0351 (!) 158/79 98.4 F (36.9 C) Oral 62 20 98 % -- --  11/05/21 0041 139/72 98 F (36.7 C) Oral 62 18 96 % -- --  11/04/21 1948 137/73 97.8 F (36.6 C) Oral 65 18 98 % -- --  11/04/21 1312 (!) 168/78 (!) 97.5 F (36.4 C) Oral 60 18 99 % -- --  11/04/21 1245 (!) 141/70 -- -- 66 18 100 % -- --  11/04/21 1230 (!) 141/74 -- -- 70 16 100 % -- --  11/04/21 1218 -- (!) 97.4 F (36.3 C) -- -- -- -- -- --  11/04/21 0850 (!) 162/75 97.6 F (36.4 C) Oral 67 17  98 % '5\' 3"'$  (1.6 m) 61.2 kg     Recent laboratory studies: No results for input(s): "WBC", "HGB", "HCT", "PLT", "NA", "K", "CL", "CO2", "BUN", "CREATININE", "GLUCOSE", "INR", "CALCIUM" in the last 72 hours.  Invalid input(s): "PT", "2"   Discharge Medications:   Allergies as of 11/05/2021   No Known Allergies      Medication List     STOP taking these medications    acetaminophen 500 MG tablet Commonly known as: TYLENOL   HYDROcodone-acetaminophen 5-325 MG tablet Commonly known as: NORCO/VICODIN       TAKE these medications    alendronate 70 MG tablet Commonly known as: FOSAMAX Take 70 mg by mouth once a week.   cyanocobalamin 1000 MCG/ML  injection Commonly known as: VITAMIN B12 Inject 1,000 mcg as directed every 30 (thirty) days.   fluticasone 50 MCG/ACT nasal spray Commonly known as: FLONASE Place 1 spray into both nostrils daily.   levothyroxine 25 MCG tablet Commonly known as: SYNTHROID Take 25 mcg by mouth daily before breakfast.   methocarbamol 500 MG tablet Commonly known as: ROBAXIN Take 1 tablet (500 mg total) by mouth every 8 (eight) hours as needed for up to 5 days for muscle spasms.   metoprolol succinate 25 MG 24 hr tablet Commonly known as: TOPROL-XL Take 25 mg by mouth daily.   ondansetron 4 MG tablet Commonly known as: Zofran Take 1 tablet (4 mg total) by mouth every 8 (eight) hours as needed for nausea or vomiting.   oxyCODONE-acetaminophen 10-325 MG tablet Commonly known as: Percocet Take 1 tablet by mouth every 6 (six) hours as needed for up to 5 days for pain.   rosuvastatin 5 MG tablet Commonly known as: CRESTOR Take 5 mg by mouth daily.   saline Gel Place 1 Application into both nostrils every 6 (six) hours as needed (dry/irritated nostrils).        Diagnostic Studies: DG THORACOLUMABAR SPINE  Result Date: 11/04/2021 CLINICAL DATA:  Spinal cord stimulator lead placement. EXAM: THORACOLUMBAR SPINE 1V Radiation exposure index: 18.63 mGy. COMPARISON:  None Available. FINDINGS: Two intraoperative fluoroscopic images were obtained. These demonstrate placement of surgical stimulator leads at approximately the T8-T9 levels. IMPRESSION: Fluoroscopic guidance provided during thoracic spinal cord stimulator placement. Electronically Signed   By: Marijo Conception M.D.   On: 11/04/2021 12:46   DG C-Arm 1-60 Min-No Report  Result Date: 11/04/2021 Fluoroscopy was utilized by the requesting physician.  No radiographic interpretation.   DG C-Arm 1-60 Min-No Report  Result Date: 11/04/2021 Fluoroscopy was utilized by the requesting physician.  No radiographic interpretation.    Discharge  Instructions     Incentive spirometry RT   Complete by: As directed         Follow-up Information     Melina Schools, MD. Schedule an appointment as soon as possible for a visit in 2 week(s).   Specialty: Orthopedic Surgery Why: If symptoms worsen, For suture removal, For wound re-check Contact information: [address] [place] [address] [phone]                 Discharge Plan:  discharge to home  Disposition: Patient is doing well overall.  She unfortunately had an episode of nausea and emesis which caused increased pain but her abdominal discomfort has improved.  She is tolerating a regular diet and voiding spontaneously.  Her pain is well controlled with oral medications.  She remains neurologically intact.  If she is cleared by PT/OT today  she will be discharged to home.    Signed: Dahlia Bailiff for Dr. Melina Schools [facility] [phone] 11/05/2021, 8:23 AM

## 2021-11-05 NOTE — Progress Notes (Signed)
Patient alert and oriented, voiding adequately, skin clean, dry and intact without evidence of skin break down, or symptoms of complications - no redness or edema noted, only slight tenderness at site.  Patient states pain is manageable at time of discharge. Patient has an appointment with MD in 2 weeks 

## 2021-11-05 NOTE — Evaluation (Signed)
Occupational Therapy Evaluation Patient Details Name: Brittany Ashley MRN: [id number] DOB: 27-Jan-1940 Today's Date: 11/05/2021   History of Present Illness 81 yo female s/p spinal cord stimulator placement on 10/16. PMH including DDD, HTN, OA, hypothyroidism, scoliosis of lumbar spine, and prior back sx.   Clinical Impression   PTA, pt was living with her husband, daughter, and son-in-law and was independent. Currently, pt requires Min Guard A for ADLs and functional mobility with RW; Min A for single hand held without RW during mobility. Provided education on back precautions, bed mobility, grooming, LB ADLs with reacher, and toileting; pt demonstrated understanding. Pt reporting her daughter and assist with socks and shoes at home. Answered all pt questions. Pt would benefit from further acute OT to facilitate safe dc. Recommend dc to home once medically stable per physician.    Recommendations for follow up therapy are one component of a multi-disciplinary discharge planning process, led by the attending physician.  Recommendations may be updated based on patient status, additional functional criteria and insurance authorization.   Follow Up Recommendations  No OT follow up    Assistance Recommended at Discharge Frequent or constant Supervision/Assistance  Patient can return home with the following      Functional Status Assessment  Patient has had a recent decline in their functional status and demonstrates the ability to make significant improvements in function in a reasonable and predictable amount of time.  Equipment Recommendations  Other (comment) (RW)    Recommendations for Other Services       Precautions / Restrictions Precautions Precautions: Back Precaution Booklet Issued: No Required Braces or Orthoses: Other Brace Other Brace: no brace per MD Restrictions Weight Bearing Restrictions: No      Mobility Bed Mobility Overal bed mobility: Needs Assistance Bed  Mobility: Rolling, Sidelying to Sit, Sit to Sidelying Rolling: Min guard Sidelying to sit: Min guard     Sit to sidelying: Min assist General bed mobility comments: Min Guard and increased time for log roll out of bed. Min A for managing LEs to return to bed    Transfers Overall transfer level: Needs assistance Equipment used: None Transfers: Sit to/from Stand Sit to Stand: Min guard           General transfer comment: Min Guard A for safety      Balance Overall balance assessment: Needs assistance Sitting-balance support: No upper extremity supported, Feet supported Sitting balance-Leahy Scale: Good     Standing balance support: No upper extremity supported, During functional activity Standing balance-Leahy Scale: Fair                             ADL either performed or assessed with clinical judgement   ADL Overall ADL's : Needs assistance/impaired Eating/Feeding: Set up;Sitting   Grooming: Oral care;Supervision/safety;Standing Grooming Details (indicate cue type and reason): cues for compensatory techniques Upper Body Bathing: Set up;Sitting   Lower Body Bathing: Min guard;Sit to/from stand   Upper Body Dressing : Set up;Supervision/safety;Sitting   Lower Body Dressing: Min guard;Cueing for sequencing;Cueing for compensatory techniques;Sit to/from stand;With adaptive equipment Lower Body Dressing Details (indicate cue type and reason): Educating on donning depends with reacher. Pt reports daughter can assist with socks and shoes Toilet Transfer: Min guard;Ambulation;BSC/3in1   Toileting- Water quality scientist and Hygiene: Supervision/safety;Sit to/from stand       Functional mobility during ADLs: Min guard;Minimal assistance;Rolling walker (2 wheels) (Min Guard A with RW and Min A for single hand  held A) General ADL Comments: Providing education on back precautions, bed mobility, grooming, UB ADLs, LB ADLs, toileting, and functional transfers.      Vision Baseline Vision/History: 1 Wears glasses       Perception     Praxis      Pertinent Vitals/Pain Pain Assessment Pain Assessment: Faces Faces Pain Scale: Hurts little more Pain Location: Back Pain Descriptors / Indicators: Burning Pain Intervention(s): Monitored during session, Repositioned     Hand Dominance Right   Extremity/Trunk Assessment Upper Extremity Assessment Upper Extremity Assessment: Overall WFL for tasks assessed   Lower Extremity Assessment Lower Extremity Assessment: Defer to PT evaluation   Cervical / Trunk Assessment Cervical / Trunk Assessment: Back Surgery   Communication Communication Communication: No difficulties   Cognition Arousal/Alertness: Awake/alert Behavior During Therapy: WFL for tasks assessed/performed Overall Cognitive Status: Within Functional Limits for tasks assessed                                       General Comments  Good adherance to back precautions    Exercises     Shoulder Instructions      Home Living Family/patient expects to be discharged to:: Private residence Living Arrangements: Spouse/significant other;Children Available Help at Discharge: Family;Available 24 hours/day Type of Home: House Home Access: Stairs to enter [employer] of Steps: 2 Entrance Stairs-Rails: None Home Layout: Two level;Able to live on main level with bedroom/bathroom     Bathroom Shower/Tub: Occupational psychologist: Standard     Home Equipment: Cane - single point;BSC/3in1          Prior Functioning/Environment Prior Level of Function : Independent/Modified Independent               ADLs Comments: Daughter (and son-in-law) lives with her to assist with caring for husband and IADLs.        OT Problem List: Decreased strength;Decreased range of motion;Decreased activity tolerance;Impaired balance (sitting and/or standing);Decreased knowledge of use of DME or  AE;Decreased knowledge of precautions      OT Treatment/Interventions: Self-care/ADL training;Therapeutic exercise;Energy conservation;DME and/or AE instruction;Patient/family education    OT Goals(Current goals can be found in the care plan section) Acute Rehab OT Goals Patient Stated Goal: Go home OT Goal Formulation: With patient Time For Goal Achievement: 11/19/21 Potential to Achieve Goals: Good  OT Frequency: Min 2X/week    Co-evaluation              AM-PAC OT "6 Clicks" Daily Activity     Outcome Measure Help from another person eating meals?: A Little Help from another person taking care of personal grooming?: A Little Help from another person toileting, which includes using toliet, bedpan, or urinal?: A Little Help from another person bathing (including washing, rinsing, drying)?: A Little Help from another person to put on and taking off regular upper body clothing?: A Little Help from another person to put on and taking off regular lower body clothing?: A Little 6 Click Score: 18   End of Session Equipment Utilized During Treatment: Rolling walker (2 wheels) Nurse Communication: Mobility status  Activity Tolerance: Patient tolerated treatment well Patient left: in bed;with call bell/phone within reach  OT Visit Diagnosis: Unsteadiness on feet (R26.81);Other abnormalities of gait and mobility (R26.89);Muscle weakness (generalized) (M62.81)                Time: 0867-6195 OT Time Calculation (min):  36 min Charges:  OT General Charges $OT Visit: 1 Visit OT Evaluation $OT Eval Low Complexity: 1 Low OT Treatments $Self Care/Home Management : 8-22 mins  Yamel Bale MSOT, OTR/L Acute Rehab Office: [place] 11/05/2021, 9:49 AM

## 2021-11-05 NOTE — Evaluation (Signed)
Physical Therapy Evaluation Patient Details Name: Brittany Ashley MRN: [id number] DOB: [birth year] Today's Date: 11/05/2021  History of Present Illness  81 yo female s/p spinal cord stimulator placement on 10/16. PMH including DDD, HTN, OA, hypothyroidism, scoliosis of lumbar spine, and prior back sx.  Clinical Impression  PTA pt living with husband, daughter and son in-law in multistory home, bed and bath on first floor, and 2 steps to enter. Pt reports independence with ambulation, and ADLs, family assists with iADLs and care for husband. Pt is currently limited in safe mobility by increased back pain and generalized weakness. Pt is currently min guard for bed mobility and transfers, supervision for ambulation and min A for stairs. Pt educated on car transfers and need for ambulation frequently throughout the day. Pt will not have any PT needs at discharge, however PT will continue to follow acutely.    Recommendations for follow up therapy are one component of a multi-disciplinary discharge planning process, led by the attending physician.  Recommendations may be updated based on patient status, additional functional criteria and insurance authorization.  Follow Up Recommendations Follow physician's recommendations for discharge plan and follow up therapies      Assistance Recommended at Discharge Frequent or constant Supervision/Assistance  Patient can return home with the following  A little help with walking and/or transfers;A little help with bathing/dressing/bathroom;Assistance with cooking/housework;Assist for transportation;Help with stairs or ramp for entrance    Equipment Recommendations Rolling walker (2 wheels)     Functional Status Assessment Patient has had a recent decline in their functional status and demonstrates the ability to make significant improvements in function in a reasonable and predictable amount of time.     Precautions / Restrictions Precautions Precautions:  Back Precaution Booklet Issued: No Required Braces or Orthoses: Other Brace Other Brace: no brace per MD Restrictions Weight Bearing Restrictions: No      Mobility  Bed Mobility Overal bed mobility: Needs Assistance Bed Mobility: Rolling, Sidelying to Sit, Sit to Sidelying Rolling: Min guard Sidelying to sit: Min guard       General bed mobility comments: min guard for bed mobility    Transfers Overall transfer level: Needs assistance Equipment used: None Transfers: Sit to/from Stand Sit to Stand: Min guard           General transfer comment: Min Guard A for safety, vc for hand placement    Ambulation/Gait Ambulation/Gait assistance: Supervision Gait Distance (Feet): 200 Feet Assistive device: Rolling walker (2 wheels) Gait Pattern/deviations: Step-through pattern, Decreased step length - right, Decreased step length - left Gait velocity: slowed Gait velocity interpretation: <1.31 ft/sec, indicative of household ambulator   General Gait Details: supervision for safety, slowed, mildly unsteady gait, no overt LoB, pt ocassionally stops and breathes deeply, however when asked about pain denies. despite grimace on face, pt reports she feels better having the RW to use  Stairs Stairs: Yes Stairs assistance: Min assist Stair Management: One rail Right, Forwards, Step to pattern Number of Stairs: 2 General stair comments: PT provide hand assist on L side to support with pt R hand on rail, min A for steadying, pt with audible sigh with descend of each step      Balance Overall balance assessment: Needs assistance Sitting-balance support: No upper extremity supported, Feet supported Sitting balance-Leahy Scale: Good     Standing balance support: No upper extremity supported, During functional activity Standing balance-Leahy Scale: Fair  Pertinent Vitals/Pain Pain Assessment Faces Pain Scale: Hurts little more Pain  Location: Back Pain Descriptors / Indicators: Burning    Home Living Family/patient expects to be discharged to:: Private residence Living Arrangements: Spouse/significant other;Children Available Help at Discharge: Family;Available 24 hours/day Type of Home: House Home Access: Stairs to enter Entrance Stairs-Rails: None Entrance Stairs-Number of Steps: 2   Home Layout: Two level;Able to live on main level with bedroom/bathroom Home Equipment: Kasandra Knudsen - single point;BSC/3in1;Hand held shower head      Prior Function Prior Level of Function : Independent/Modified Independent               ADLs Comments: Daughter (and son-in-law) lives with her to assist with caring for husband and IADLs.     Hand Dominance   Dominant Hand: Right    Extremity/Trunk Assessment   Upper Extremity Assessment Upper Extremity Assessment: Defer to OT evaluation    Lower Extremity Assessment Lower Extremity Assessment: Overall WFL for tasks assessed    Cervical / Trunk Assessment Cervical / Trunk Assessment: Back Surgery  Communication   Communication: No difficulties  Cognition Arousal/Alertness: Awake/alert Behavior During Therapy: WFL for tasks assessed/performed Overall Cognitive Status: Within Functional Limits for tasks assessed                                          General Comments General comments (skin integrity, edema, etc.): good recall and adherence of precautions        Assessment/Plan    PT Assessment Patient does not need any further PT services         PT Goals (Current goals can be found in the Care Plan section)  Acute Rehab PT Goals Patient Stated Goal: get back to walking PT Goal Formulation: With patient Time For Goal Achievement: 11/19/21 Potential to Achieve Goals: Good     AM-PAC PT "6 Clicks" Mobility  Outcome Measure Help needed turning from your back to your side while in a flat bed without using bedrails?: None Help needed  moving from lying on your back to sitting on the side of a flat bed without using bedrails?: None Help needed moving to and from a bed to a chair (including a wheelchair)?: None Help needed standing up from a chair using your arms (e.g., wheelchair or bedside chair)?: None Help needed to walk in hospital room?: None Help needed climbing 3-5 steps with a railing? : A Little 6 Click Score: 23    End of Session Equipment Utilized During Treatment: Gait belt Activity Tolerance: Patient tolerated treatment well Patient left: in chair;with call bell/phone within reach;with nursing/sitter in room Nurse Communication: Mobility status;Patient requests pain meds PT Visit Diagnosis: Muscle weakness (generalized) (M62.81);Pain Pain - part of body:  (back)    Time: 3474-2595 PT Time Calculation (min) (ACUTE ONLY): 19 min   Charges:   PT Evaluation $PT Eval Moderate Complexity: 1 Mod          Elease Swarm B. Migdalia Dk PT, DPT Acute Rehabilitation Services Please use secure chat or  Call Office [phone]   [place] 11/05/2021, 11:17 AM
